# Patient Record
Sex: Female | Born: 1950
Health system: Southern US, Community
[De-identification: ages and names within clinical notes are randomized; demographics above are authoritative.]

## PROBLEM LIST (undated history)

## (undated) DIAGNOSIS — S82899A Other fracture of unspecified lower leg, initial encounter for closed fracture: Secondary | ICD-10-CM

## (undated) DIAGNOSIS — L111 Transient acantholytic dermatosis [Grover]: Secondary | ICD-10-CM

## (undated) DIAGNOSIS — K649 Unspecified hemorrhoids: Secondary | ICD-10-CM

## (undated) DIAGNOSIS — I82409 Acute embolism and thrombosis of unspecified deep veins of unspecified lower extremity: Secondary | ICD-10-CM

## (undated) HISTORY — PX: DILATION AND CURETTAGE OF UTERUS: SHX78

## (undated) HISTORY — PX: OTHER SURGICAL HISTORY: SHX169

## (undated) HISTORY — DX: Acute embolism and thrombosis of unspecified deep veins of unspecified lower extremity: I82.409

## (undated) HISTORY — DX: Other fracture of unspecified lower leg, initial encounter for closed fracture: S82.899A

---

## 1998-12-12 ENCOUNTER — Other Ambulatory Visit: Admission: RE | Admit: 1998-12-12 | Discharge: 1998-12-12 | Payer: Self-pay | Admitting: Obstetrics and Gynecology

## 2000-01-04 ENCOUNTER — Other Ambulatory Visit: Admission: RE | Admit: 2000-01-04 | Discharge: 2000-01-04 | Payer: Self-pay | Admitting: Obstetrics and Gynecology

## 2001-02-04 ENCOUNTER — Other Ambulatory Visit: Admission: RE | Admit: 2001-02-04 | Discharge: 2001-02-04 | Payer: Self-pay | Admitting: Obstetrics and Gynecology

## 2003-10-20 ENCOUNTER — Other Ambulatory Visit: Admission: RE | Admit: 2003-10-20 | Discharge: 2003-10-20 | Payer: Self-pay | Admitting: Obstetrics and Gynecology

## 2004-12-20 ENCOUNTER — Other Ambulatory Visit: Admission: RE | Admit: 2004-12-20 | Discharge: 2004-12-20 | Payer: Self-pay | Admitting: *Deleted

## 2005-12-06 LAB — HM MAMMOGRAPHY: HM Mammogram: NEGATIVE

## 2006-03-26 ENCOUNTER — Other Ambulatory Visit: Admission: RE | Admit: 2006-03-26 | Discharge: 2006-03-26 | Payer: Self-pay | Admitting: Obstetrics and Gynecology

## 2006-10-22 ENCOUNTER — Encounter
Admission: RE | Admit: 2006-10-22 | Discharge: 2006-10-22 | Payer: Self-pay | Admitting: Physical Medicine and Rehabilitation

## 2007-05-14 ENCOUNTER — Other Ambulatory Visit: Admission: RE | Admit: 2007-05-14 | Discharge: 2007-05-14 | Payer: Self-pay | Admitting: Obstetrics & Gynecology

## 2008-05-18 ENCOUNTER — Other Ambulatory Visit: Admission: RE | Admit: 2008-05-18 | Discharge: 2008-05-18 | Payer: Self-pay | Admitting: Obstetrics and Gynecology

## 2009-04-21 ENCOUNTER — Ambulatory Visit: Payer: Self-pay | Admitting: Family Medicine

## 2009-07-04 ENCOUNTER — Ambulatory Visit: Payer: Self-pay | Admitting: Family Medicine

## 2010-01-09 ENCOUNTER — Ambulatory Visit: Payer: Self-pay | Admitting: Family Medicine

## 2010-02-01 ENCOUNTER — Ambulatory Visit: Payer: Self-pay | Admitting: Family Medicine

## 2011-06-12 ENCOUNTER — Encounter: Payer: Self-pay | Admitting: Family Medicine

## 2011-06-26 ENCOUNTER — Telehealth: Payer: Self-pay | Admitting: Family Medicine

## 2011-06-26 MED ORDER — IBUPROFEN 800 MG PO TABS: 800.0000 mg | ORAL_TABLET | Freq: Three times a day (TID) | ORAL | Status: AC | PRN

## 2011-06-26 NOTE — Telephone Encounter (Signed)
Ibuprofen called in.

## 2012-02-12 ENCOUNTER — Encounter: Payer: Self-pay | Admitting: Family Medicine

## 2012-02-12 ENCOUNTER — Ambulatory Visit (INDEPENDENT_AMBULATORY_CARE_PROVIDER_SITE_OTHER): Payer: BC Managed Care – PPO | Admitting: Family Medicine

## 2012-02-12 VITALS — BP 110/72 | HR 75 | Wt 139.0 lb

## 2012-02-12 DIAGNOSIS — L989 Disorder of the skin and subcutaneous tissue, unspecified: Secondary | ICD-10-CM

## 2012-02-12 NOTE — Progress Notes (Signed)
  Subjective:    Patient ID: Alicia Alvarez, female    DOB: Feb 02, 1951, 61 y.o.   MRN: [id number]  HPI She is here for consult concerning skin lesions. She was seen by Dr. Leta Speller who gave her 5-FU which had no effect on it. These are mainly on the lower extremities. She did have a biopsy on one of the lesions which was apparently benign.   Review of Systems     Objective:   Physical Exam She has scattered flat lesions all but one are less than  0.5 cm. They are not erythematous and have distinct borders. One lesion is approximately 1 cm and slightly raised but nonpigmented       Assessment & Plan:   1. Skin lesions, generalized    I explained that all the lesions are benign. I did discuss various methods to help get rid of them including using Compound W or freezing them. They are more of a nuisance than they are actually any danger. Also mentioned getting a Zostavax shot. She states that she has had 2 episodes of shingles and at this time is not interested.

## 2012-02-27 ENCOUNTER — Telehealth: Payer: Self-pay | Admitting: Internal Medicine

## 2012-02-28 MED ORDER — CARISOPRODOL 350 MG PO TABS: ORAL_TABLET | ORAL | Status: DC

## 2012-02-28 NOTE — Telephone Encounter (Signed)
Called in soma 350mg  #12 for pt and will call and left message on pt vm notifying her that we sent rx to pharmacy

## 2012-02-28 NOTE — Telephone Encounter (Signed)
Call and soma 350 mg. Give her a dozen of them

## 2012-03-24 ENCOUNTER — Telehealth: Payer: Self-pay | Admitting: Family Medicine

## 2012-03-24 NOTE — Telephone Encounter (Signed)
She is having difficulty with trapezius and deltoid muscle pain. She has been doing a lot of swimming. She has seen a massage therapist who agrees with the above. I recommend she back off on swimming for least 2 weeks and if things calm down, start back at 50% level.

## 2012-08-15 ENCOUNTER — Telehealth: Payer: Self-pay | Admitting: Family Medicine

## 2012-08-15 NOTE — Telephone Encounter (Signed)
Idon't mind trying but a lot of times these cysts are very difficult to get fluid out of and they recur

## 2012-08-15 NOTE — Telephone Encounter (Signed)
CALLED PT LEFT MESSAGE WORD FOR WORD

## 2012-08-21 ENCOUNTER — Ambulatory Visit: Payer: BC Managed Care – PPO | Admitting: Family Medicine

## 2012-10-15 ENCOUNTER — Other Ambulatory Visit: Payer: Self-pay | Admitting: *Deleted

## 2012-10-15 MED ORDER — ESTRADIOL 0.0375 MG/24HR TD PTTW: 1.0000 | MEDICATED_PATCH | TRANSDERMAL | Status: DC

## 2012-10-15 NOTE — Telephone Encounter (Signed)
Aex 10/22/12 with DL

## 2012-10-15 NOTE — Addendum Note (Signed)
Addended by: Lorraine Lax on: 10/15/2012 02:30 PM   Modules accepted: Orders

## 2012-10-15 NOTE — Telephone Encounter (Signed)
Rx called in to pharmacy. 

## 2012-10-15 NOTE — Addendum Note (Signed)
Addended by: Lorraine Lax on: 10/15/2012 12:59 PM   Modules accepted: Orders

## 2012-10-21 ENCOUNTER — Encounter: Payer: Self-pay | Admitting: Certified Nurse Midwife

## 2012-10-22 ENCOUNTER — Other Ambulatory Visit: Payer: Self-pay | Admitting: Certified Nurse Midwife

## 2012-10-22 ENCOUNTER — Ambulatory Visit (INDEPENDENT_AMBULATORY_CARE_PROVIDER_SITE_OTHER): Payer: BC Managed Care – PPO | Admitting: Certified Nurse Midwife

## 2012-10-22 ENCOUNTER — Encounter: Payer: Self-pay | Admitting: Certified Nurse Midwife

## 2012-10-22 VITALS — BP 106/64 | Ht 66.75 in | Wt 142.0 lb

## 2012-10-22 DIAGNOSIS — Z01419 Encounter for gynecological examination (general) (routine) without abnormal findings: Secondary | ICD-10-CM

## 2012-10-22 NOTE — Progress Notes (Addendum)
62 y.o. DivorcedCaucasian female   Alicia Alvarez here for annual exam.  Menopausal on HRT desires continuance.  Continues to have hot flashes and night sweats when she tries to stop her medications. Denies any vaginal bleeding or vaginal dryness.  Not sexually active.  Sees PCP prn, plans visit for labs this year. No health issues today.  Patient's last menstrual period was 07/30/2002.          Sexually active: no  The current method of family planning is abstinence.    Exercising: yes  yoga, swimming Last mammogram: 11/26/11, f/u left breast 5/13 Last pap: 05/25/10 Last BMD: none Alcohol:2-3 a week Tobacco: none   Health Maintenance  Topic Date Due  . Influenza Vaccine  03/30/1951  . Pap Smear  11/16/1969  . Tetanus/tdap  11/17/1970  . Colonoscopy  11/16/2001  . Mammogram  12/07/2007  . Zostavax  11/17/2011    Family History  Problem Relation Age of Onset  . Cancer Father     PROSTATE    There is no problem list on file for this patient.   Past Medical History  Diagnosis Date  . Ankle fracture     right    Past Surgical History  Procedure Laterality Date  . Cesarean section      Allergies: Macrodantin and Sulfa antibiotics  Current Outpatient Prescriptions  Medication Sig Dispense Refill  . estradiol (MINIVELLE) 0.0375 MG/24HR Place 1 patch onto the skin 2 (two) times a week.  1 patch  0  . progesterone (PROMETRIUM) 100 MG capsule Take 100 mg by mouth daily.         No current facility-administered medications for this visit.    ROS: Pertinent items are noted in HPI.  Exam:    BP 106/64  Ht 5' 6.75" (1.695 m)  Wt 142 lb (64.411 kg)  BMI 22.42 kg/m2  LMP 07/30/2002 Weight change: @WEIGHTCHANGE @ Last 3 height recordings:  Ht Readings from Last 3 Encounters:  10/22/12 5' 6.75" (1.695 m)   General appearance: alert, cooperative and appears stated age Head: Normocephalic, without obvious abnormality, atraumatic Neck: no adenopathy, supple, symmetrical,  trachea midline and thyroid not enlarged, symmetric, no tenderness/mass/nodules Lungs: clear to auscultation bilaterally Breasts: normal appearance, no masses or tenderness, Inspection negative, No nipple retraction or dimpling, No nipple discharge or bleeding, No axillary or supraclavicular adenopathy Heart: regular rate and rhythm Abdomen: soft, non-tender; bowel sounds normal; no masses,  no organomegaly Extremities: extremities normal, atraumatic, no cyanosis or edema Skin: Skin color, texture, turgor normal. No rashes or lesions Lymph nodes: Cervical, supraclavicular, and axillary nodes normal. no inguinal nodes palpated Neurologic: Alert and oriented X 3, normal strength and tone. Normal symmetric reflexes. Normal coordination and gait   Pelvic: External genitalia:  no lesions              Urethra: not indicated and normal appearing urethra with no masses, tenderness or lesions              Bartholins and Skenes: normal, Bartholin's, Urethra, Skene's normal                 Vagina: normal appearing vagina with normal color and discharge, no lesions              Cervix: normal appearance              Pap taken: yes        Bimanual Exam:  Uterus:  uterus is normal size, shape, consistency and nontender  Adnexa:    not indicated and normal adnexa in size, nontender and no masses                                      Rectovaginal: Confirms                                      Anus:  normal sphincter tone, no lesions  A:1- well woman exam 2- Menopausal on HRT       P:1- Reviewed health wellness pertinent to exam. Recommend mammogram and  pap smear annually if indicated 2- Rx Minivelle 0.0375mg  2 x weekly x 1 year with instructions and warning signs and symptoms given Rx Prometrium 100 mg daily # 3months supply x 1 year.  return annually or prn      An After Visit Summary was printed and given to the patient.  Reviewed, TL

## 2012-10-22 NOTE — Patient Instructions (Addendum)

## 2012-10-23 LAB — IPS PAP TEST WITH HPV

## 2012-10-31 ENCOUNTER — Encounter: Payer: Self-pay | Admitting: Certified Nurse Midwife

## 2012-12-02 ENCOUNTER — Other Ambulatory Visit: Payer: Self-pay | Admitting: *Deleted

## 2012-12-02 MED ORDER — PROGESTERONE MICRONIZED 100 MG PO CAPS: 100.0000 mg | ORAL_CAPSULE | Freq: Every day | ORAL | Status: DC

## 2012-12-02 MED ORDER — ESTRADIOL 0.0375 MG/24HR TD PTTW: 1.0000 | MEDICATED_PATCH | TRANSDERMAL | Status: DC

## 2012-12-02 NOTE — Telephone Encounter (Signed)
Faxed refill request received from pharmacy for [facility] Last filled by MD on 10/15/12, #8 X O Last AEX - 10/22/12 Next AEX - not scheduled Note in AEX document stating progesterone and MINIVELLE refilled x 1 year.  RX's not sent at that time.  Both RX's sent today.

## 2012-12-08 ENCOUNTER — Other Ambulatory Visit: Payer: Self-pay | Admitting: *Deleted

## 2012-12-08 MED ORDER — PROGESTERONE MICRONIZED 100 MG PO CAPS: 100.0000 mg | ORAL_CAPSULE | Freq: Every day | ORAL | Status: DC

## 2012-12-08 NOTE — Telephone Encounter (Signed)
Faxed refill request received from pharmacy for 90 DAY supply PROGESTERONE Last filled by MD on 12/02/12, #30 X 11 Last AEX - 10/22/12 Next AEX - not scheduled 90 day supply sent.

## 2013-06-30 ENCOUNTER — Telehealth: Payer: Self-pay | Admitting: Family Medicine

## 2013-06-30 NOTE — Telephone Encounter (Signed)
I know what kind of a reaction she had. I can't write a note without Whether was a local reaction or allergic

## 2013-06-30 NOTE — Telephone Encounter (Signed)
LEFT MESSAGE FOR PT ON CELL # THAT DR.LALONDE COULD NOT WRITE A NOTE BECAUSE HE DOESN'T KNOW IF IT WAS A LOCAL OR ALLERGIC REACTION TO SHOT PT HAS NOT BEEN SEEN SINCE July 2013

## 2013-07-02 ENCOUNTER — Encounter: Payer: Self-pay | Admitting: Family Medicine

## 2013-07-02 ENCOUNTER — Ambulatory Visit (INDEPENDENT_AMBULATORY_CARE_PROVIDER_SITE_OTHER): Payer: BC Managed Care – PPO | Admitting: Family Medicine

## 2013-07-02 VITALS — BP 100/70 | HR 60 | Wt 139.0 lb

## 2013-07-02 DIAGNOSIS — T50A95A Adverse effect of other bacterial vaccines, initial encounter: Secondary | ICD-10-CM

## 2013-07-02 DIAGNOSIS — T50995A Adverse effect of other drugs, medicaments and biological substances, initial encounter: Secondary | ICD-10-CM

## 2013-07-02 DIAGNOSIS — T50B95A Adverse effect of other viral vaccines, initial encounter: Secondary | ICD-10-CM

## 2013-07-02 NOTE — Progress Notes (Signed)
   Subjective:    Patient ID: Alicia Alvarez, female    DOB: [birth year], 62 y.o.   MRN: [id number]  HPI Is here for consult concerning flu shot. She had a flu shot several years ago and had difficulty after that for 3 or 4 days with myalgias, fatigue, headache. No rash present . She states that within 6 hours she developed fatigue and muscle weakness lasted for 3-4 days.   Review of Systems     Objective:   Physical Exam Alert and in no distress otherwise not examined       Assessment & Plan:  Adverse reaction to influenza vaccine, initial encounter  Her reaction to the vaccine occurred relatively quickly after receiving it and therefore I am very reluctant to have her get another shot. This does not seem to meet the typical flulike symptoms that can occur for several days after an injection. I did discuss the possibility of her getting a Zostavax and she is to let have that shot as well.

## 2013-09-28 ENCOUNTER — Ambulatory Visit (INDEPENDENT_AMBULATORY_CARE_PROVIDER_SITE_OTHER): Payer: 59 | Admitting: Family Medicine

## 2013-09-28 ENCOUNTER — Telehealth: Payer: Self-pay | Admitting: Family Medicine

## 2013-09-28 DIAGNOSIS — R350 Frequency of micturition: Secondary | ICD-10-CM

## 2013-09-28 DIAGNOSIS — R300 Dysuria: Secondary | ICD-10-CM

## 2013-09-28 DIAGNOSIS — N390 Urinary tract infection, site not specified: Secondary | ICD-10-CM

## 2013-09-28 LAB — POCT URINALYSIS DIPSTICK
BILIRUBIN UA: NEGATIVE
GLUCOSE UA: NEGATIVE
KETONES UA: NEGATIVE
Nitrite, UA: NEGATIVE
Urobilinogen, UA: NEGATIVE
pH, UA: 7

## 2013-09-28 MED ORDER — CIPROFLOXACIN HCL 500 MG PO TABS: 500.0000 mg | ORAL_TABLET | Freq: Two times a day (BID) | ORAL | Status: DC

## 2013-09-28 NOTE — Telephone Encounter (Signed)
I am okay with her coming in for a UA only. Put her  on my schedule get the results

## 2013-09-28 NOTE — Progress Notes (Signed)
   Subjective:    Patient ID: Alicia Alvarez, female    DOB: [birth year], 63 y.o.   MRN: [id number]  HPI She complains of urinary frequency, dysuria and chills.  Review of Systems     Objective:   Physical Exam Patient was not seen. A urinalysis dipstick was positive for white cells and red cells       Assessment & Plan:

## 2013-09-28 NOTE — Progress Notes (Signed)
Called in CIPRO 500mg  to [facility] pharmacy @ [phone]

## 2013-09-29 ENCOUNTER — Telehealth: Payer: Self-pay | Admitting: Family Medicine

## 2013-09-29 NOTE — Telephone Encounter (Signed)
Call in amoxicillin 875 twice a day. #20

## 2013-09-29 NOTE — Telephone Encounter (Signed)
Medication called in 

## 2013-10-08 ENCOUNTER — Telehealth: Payer: Self-pay | Admitting: Family Medicine

## 2013-10-08 NOTE — Telephone Encounter (Signed)
875 twice a day, 20 pills

## 2013-10-08 NOTE — Telephone Encounter (Signed)
Call Amoxil in to her pharmacy.

## 2013-10-08 NOTE — Telephone Encounter (Signed)
What strength and instructions?

## 2013-10-15 ENCOUNTER — Other Ambulatory Visit: Payer: Self-pay

## 2013-10-15 MED ORDER — AMOXICILLIN 875 MG PO TABS: 875.0000 mg | ORAL_TABLET | Freq: Two times a day (BID) | ORAL | Status: DC

## 2013-11-04 ENCOUNTER — Telehealth: Payer: Self-pay | Admitting: Family Medicine

## 2013-11-12 NOTE — Telephone Encounter (Signed)
tsd  °

## 2013-12-07 ENCOUNTER — Ambulatory Visit (INDEPENDENT_AMBULATORY_CARE_PROVIDER_SITE_OTHER): Payer: 59 | Admitting: Family Medicine

## 2013-12-07 ENCOUNTER — Telehealth: Payer: Self-pay | Admitting: Family Medicine

## 2013-12-07 DIAGNOSIS — R350 Frequency of micturition: Secondary | ICD-10-CM

## 2013-12-07 LAB — POCT URINALYSIS DIPSTICK
Bilirubin, UA: NEGATIVE
Blood, UA: NEGATIVE
GLUCOSE UA: NEGATIVE
KETONES UA: NEGATIVE
Nitrite, UA: NEGATIVE
Protein, UA: NEGATIVE
Urobilinogen, UA: NEGATIVE
pH, UA: 5

## 2013-12-07 MED ORDER — DOXYCYCLINE HYCLATE 100 MG PO TABS: 100.0000 mg | ORAL_TABLET | Freq: Two times a day (BID) | ORAL | Status: DC

## 2013-12-07 NOTE — Progress Notes (Signed)
   Subjective:    Patient ID: Alicia Alvarez, female    DOB: Nov 23, 1950, 63 y.o.   MRN: [id number]  HPI She was treated over the phone in March for frequency and dysuria. She was given Cipro and had unacceptable side effects. She was then switched to Amoxil and her symptoms were relieved. She started having frequency and dysuria again yesterday. She apparently has an allergic reaction to sulfa and Macrodantin. She has been more sexually active recently and thinks this is playing a role. She is also having second thoughts about the relationship she is involved in.  Review of Systems     Objective:   Physical Exam Alert and in no distress otherwise not examined Microscopic evaluation of the urine did show bacteria and white cells.      Assessment & Plan:  Urinary frequency - Plan: Urinalysis Dipstick, doxycycline (VIBRA-TABS) 100 MG tablet  discussed treatment with her. Recommend she obtain her bladder after sexual activity. Discussed the possibility of different sexual positions playing a role in this as well as tensely down the Road using an antibiotic after sex for prevention. We then talked about the relationship she is in. She is getting counseling to help with this. She does have a good handle on how to approach this situation.

## 2013-12-07 NOTE — Telephone Encounter (Signed)
Pt states you treated her several weeks ago for UTI.  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  She states you will only want a urine sample and not an appointment.  Please advise [phone].  I have her on the schedule for this morning.

## 2013-12-15 ENCOUNTER — Other Ambulatory Visit: Payer: Self-pay | Admitting: *Deleted

## 2013-12-15 MED ORDER — PROGESTERONE MICRONIZED 100 MG PO CAPS: 100.0000 mg | ORAL_CAPSULE | Freq: Every day | ORAL | Status: DC

## 2013-12-15 MED ORDER — ESTRADIOL 0.0375 MG/24HR TD PTTW: 1.0000 | MEDICATED_PATCH | TRANSDERMAL | Status: DC

## 2013-12-15 NOTE — Telephone Encounter (Signed)
Last AEX 10/22/12 Last refill 11/2012 for 1 year.  Patient needs to schedule AEX and have MMG done. Rx sent for 30 days. - Per Ms Jackelyn Poling. - Patient notified and verbalized understanding. - Patient schedule appt for 6/4 10:00 am.

## 2013-12-31 ENCOUNTER — Ambulatory Visit: Payer: 59 | Admitting: Certified Nurse Midwife

## 2014-01-27 ENCOUNTER — Ambulatory Visit (INDEPENDENT_AMBULATORY_CARE_PROVIDER_SITE_OTHER): Payer: 59 | Admitting: Family Medicine

## 2014-01-27 VITALS — BP 100/60 | HR 65 | Temp 97.9°F | Wt 140.0 lb

## 2014-01-27 DIAGNOSIS — R51 Headache: Secondary | ICD-10-CM

## 2014-01-27 DIAGNOSIS — H9209 Otalgia, unspecified ear: Secondary | ICD-10-CM

## 2014-01-27 DIAGNOSIS — H9202 Otalgia, left ear: Secondary | ICD-10-CM

## 2014-01-27 DIAGNOSIS — R519 Headache, unspecified: Secondary | ICD-10-CM

## 2014-01-27 NOTE — Progress Notes (Signed)
   Subjective:    Patient ID: Alicia Alvarez, female    DOB: [birth year], 63 y.o.   MRN: [id number]  HPI She is here for consult concerning left ear pain. She does swim a lot and does use earplugs. She cleans her ears with peroxide and vinegar. She also states that the ears do itch a lot. She is concerned about possible ruptured eardrum. She also states she has had difficulty with intermittent right retro-orbital throbbing headache. She has not taken any medications for this and this has not interfered with the quality of her life. She's had no blurred vision, double vision, nausea, vomiting, photo or phonophobia.   Review of Systems     Objective:   Physical Exam Page Spiro and in no distress. Both tympanic membranes and canals are normal.       Assessment & Plan:  Otalgia of left ear  Headache, retro-ocular  recommend she use a combination of alcohol and white vinegar to clean her ears and not use Q-tips. Recommend cortisone cream for the itching on the ear meatus. Also discussed her headache but informed her that I would be more information concerning what, when, where and why.

## 2014-01-27 NOTE — Patient Instructions (Signed)
Use a combination of alcohol and white vinegar to help keep clean and use cortisone cream on the external part of ear. No Q-tips Pay attention to your headaches in terms of what when and where and why

## 2014-02-12 ENCOUNTER — Ambulatory Visit (INDEPENDENT_AMBULATORY_CARE_PROVIDER_SITE_OTHER): Payer: 59 | Admitting: Certified Nurse Midwife

## 2014-02-12 ENCOUNTER — Encounter: Payer: Self-pay | Admitting: Certified Nurse Midwife

## 2014-02-12 VITALS — BP 100/66 | HR 70 | Resp 16 | Ht 66.75 in | Wt 142.0 lb

## 2014-02-12 DIAGNOSIS — Z1211 Encounter for screening for malignant neoplasm of colon: Secondary | ICD-10-CM

## 2014-02-12 DIAGNOSIS — N951 Menopausal and female climacteric states: Secondary | ICD-10-CM

## 2014-02-12 DIAGNOSIS — Z01419 Encounter for gynecological examination (general) (routine) without abnormal findings: Secondary | ICD-10-CM

## 2014-02-12 DIAGNOSIS — Z0000 Encounter for general adult medical examination without abnormal findings: Secondary | ICD-10-CM

## 2014-02-12 LAB — COMPREHENSIVE METABOLIC PANEL
ALBUMIN: 4.6 g/dL (ref 3.5–5.2)
ALK PHOS: 56 U/L (ref 39–117)
ALT: 26 U/L (ref 0–35)
AST: 20 U/L (ref 0–37)
BILIRUBIN TOTAL: 0.7 mg/dL (ref 0.2–1.2)
BUN: 12 mg/dL (ref 6–23)
CO2: 29 meq/L (ref 19–32)
Calcium: 9.9 mg/dL (ref 8.4–10.5)
Chloride: 100 mEq/L (ref 96–112)
Creat: 0.75 mg/dL (ref 0.50–1.10)
GLUCOSE: 84 mg/dL (ref 70–99)
POTASSIUM: 4.6 meq/L (ref 3.5–5.3)
Sodium: 136 mEq/L (ref 135–145)
Total Protein: 6.7 g/dL (ref 6.0–8.3)

## 2014-02-12 LAB — LIPID PANEL
CHOL/HDL RATIO: 2 ratio
CHOLESTEROL: 220 mg/dL — AB (ref 0–200)
HDL: 109 mg/dL (ref >39)
LDL Cholesterol: 96 mg/dL (ref 0–99)
Triglycerides: 73 mg/dL (ref <150)
VLDL: 15 mg/dL (ref 0–40)

## 2014-02-12 LAB — HEMOGLOBIN, FINGERSTICK: HEMOGLOBIN, FINGERSTICK: 13.3 g/dL (ref 12.0–16.0)

## 2014-02-12 MED ORDER — ESTRADIOL 0.0375 MG/24HR TD PTTW: 1.0000 | MEDICATED_PATCH | TRANSDERMAL | Status: DC

## 2014-02-12 MED ORDER — PROGESTERONE MICRONIZED 100 MG PO CAPS: 100.0000 mg | ORAL_CAPSULE | Freq: Every day | ORAL | Status: DC

## 2014-02-12 NOTE — Progress Notes (Signed)
Reviewed personally.  M. Suzanne Deshawn Skelley, MD.  

## 2014-02-12 NOTE — Patient Instructions (Signed)

## 2014-02-12 NOTE — Progress Notes (Signed)
63 y.o. [id number] Divorced Caucasian Fe here for annual exam.  Menopausal on HRT. Denies vaginal bleeding or vaginal dryness.Sees  PCP for aex, no labs. Had one UTI in the past year, no issues. Does not plan to schedule  Colonoscopy, but will do IFOB this year. Fasted for labs today. HRT working well, desires continuance. Working on program for therapeutic touch with [employer]. No health issues today.  Patient's last menstrual period was 07/30/2002.          Sexually active: Yes.    The current method of family planning is post menopausal status.    Exercising: Yes.    yoga,running & swimming Smoker:  no  Health Maintenance: Pap: 10-22-12 neg HPV HR neg MMG: 11/26/11,f/u left breast 5/13. Scheduled for 2015 Colonoscopy: none BMD:  none TDaP: 2010 Labs: Hgb-13.3 Self breast exam: done occ   reports that she has quit smoking. She has never used smokeless tobacco. She reports that she drinks about 1.5 ounces of alcohol per week. She reports that she does not use illicit drugs.  Past Medical History  Diagnosis Date  . Ankle fracture     right    Past Surgical History  Procedure Laterality Date  . Cesarean section      Current Outpatient Prescriptions  Medication Sig Dispense Refill  . estradiol (MINIVELLE) 0.0375 MG/24HR Place 1 patch onto the skin 2 (two) times a week.  8 patch  0  . progesterone (PROMETRIUM) 100 MG capsule Take 1 capsule (100 mg total) by mouth daily.  30 capsule  0   No current facility-administered medications for this visit.    Family History  Problem Relation Age of Onset  . Cancer Father     PROSTATE    ROS:  Pertinent items are noted in HPI.  Otherwise, a comprehensive ROS was negative.  Exam:   BP 100/66  Pulse 70  Resp 16  Ht 5' 6.75" (1.695 m)  Wt 142 lb (64.411 kg)  BMI 22.42 kg/m2  LMP 07/30/2002 Height: 5' 6.75" (169.5 cm)  Ht Readings from Last 3 Encounters:  02/12/14 5' 6.75" (1.695 m)  10/22/12 5' 6.75" (1.695 m)    General appearance:  alert, cooperative and appears stated age Head: Normocephalic, without obvious abnormality, atraumatic Neck: no adenopathy, supple, symmetrical, trachea midline and thyroid normal to inspection and palpation and non-palpable Lungs: clear to auscultation bilaterally Breasts: normal appearance, no masses or tenderness, No nipple retraction or dimpling, No nipple discharge or bleeding, No axillary or supraclavicular adenopathy Heart: regular rate and rhythm Abdomen: soft, non-tender; no masses,  no organomegaly Extremities: extremities normal, atraumatic, no cyanosis or edema Skin: Skin color, texture, turgor normal. No rashes or lesions Lymph nodes: Cervical, supraclavicular, and axillary nodes normal. No abnormal inguinal nodes palpated Neurologic: Grossly normal   Pelvic: External genitalia:  no lesions              Urethra:  normal appearing urethra with no masses, tenderness or lesions              Bartholin's and Skene's: normal                 Vagina: normal appearing vagina with normal color and discharge, no lesions              Cervix: normal, non tender              Pap taken: No. Bimanual Exam:  Uterus:  normal size, contour, position, consistency, mobility, non-tender and midposition  Adnexa: normal adnexa and no mass, fullness, tenderness               Rectovaginal: Confirms               Anus:  normal sphincter tone, no lesions  A:  Well Woman with normal exam  Menopausal on HRT desires continuation  Fasting labs  Colonoscopy due declines  P:   Reviewed health and wellness pertinent to exam  Rx Minivelle see order  Rx Prometrium see order  Labs: Lipid panel, CMP, Vitamin D  Pap smear not taken today  counseled on breast self exam, mammography screening, use and side effects of HRT and evaluation yearly for continuance, adequate intake of calcium and vitamin D, diet and exercise, risk and benefits of colonoscopy, declines scheduling.  return annually or  prn  An After Visit Summary was printed and given to the patient.

## 2014-02-13 LAB — VITAMIN D 25 HYDROXY (VIT D DEFICIENCY, FRACTURES): VIT D 25 HYDROXY: 49 ng/mL (ref 30–89)

## 2014-03-15 ENCOUNTER — Ambulatory Visit: Payer: 59 | Admitting: Family Medicine

## 2014-03-23 ENCOUNTER — Telehealth: Payer: Self-pay | Admitting: Family Medicine

## 2014-03-23 NOTE — Telephone Encounter (Signed)
Requesting a letter stating that massage therapy would benefit patient medically due to the work that she does. Pt is up on her feet all day and leaning over a massage table. If pt gets this letter, insur will cover portion of her massage therapy bills.

## 2014-03-24 NOTE — Telephone Encounter (Signed)
Pt needs letter

## 2014-03-24 NOTE — Telephone Encounter (Signed)
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  When she gets a massage herself it remedies the pain in her back and muscle tension in her arms.  She goes to the massage therapist 1 x per month.  She needs letter.

## 2014-03-24 NOTE — Telephone Encounter (Signed)
I'm not sure what she is truly asking for. 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 Find out exactly what she thinks it'll help put that in her letter

## 2014-03-24 NOTE — Telephone Encounter (Signed)
Called pt and LM to call with more info

## 2014-03-25 NOTE — Telephone Encounter (Signed)
Make up a letter for me

## 2014-03-29 ENCOUNTER — Encounter: Payer: Self-pay | Admitting: Family Medicine

## 2014-03-29 NOTE — Telephone Encounter (Signed)
Done

## 2014-04-02 ENCOUNTER — Encounter: Payer: Self-pay | Admitting: Family Medicine

## 2014-04-02 ENCOUNTER — Encounter: Payer: Self-pay | Admitting: Medical

## 2014-04-02 ENCOUNTER — Ambulatory Visit (INDEPENDENT_AMBULATORY_CARE_PROVIDER_SITE_OTHER): Payer: 59 | Admitting: Medical

## 2014-04-02 VITALS — BP 110/80 | HR 64 | Temp 97.9°F | Resp 16 | Wt 140.0 lb

## 2014-04-02 DIAGNOSIS — H6123 Impacted cerumen, bilateral: Secondary | ICD-10-CM

## 2014-04-02 DIAGNOSIS — H6120 Impacted cerumen, unspecified ear: Secondary | ICD-10-CM

## 2014-04-02 DIAGNOSIS — H68003 Unspecified Eustachian salpingitis, bilateral: Secondary | ICD-10-CM

## 2014-04-02 DIAGNOSIS — H68009 Unspecified Eustachian salpingitis, unspecified ear: Secondary | ICD-10-CM

## 2014-04-02 MED ORDER — AMOXICILLIN 875 MG PO TABS
875.0000 mg | ORAL_TABLET | Freq: Two times a day (BID) | ORAL | Status: DC
Start: 2014-04-02 — End: 2014-09-10

## 2014-04-02 NOTE — Progress Notes (Signed)
Subjective: Been dealing with ear discomfort since July.  Saw Dr. Redmond [facility] for this prior.  10 days ago went to employee health, advised she had wax on tympanic membrane and symptoms of eustachian tube dysfunction.  She notes ongoing ear popping, discomfort.   Some ringing in both ears.  Tried Flonase ,sudafed TID.   Sometimes feels drainage down throat.  No fever, no nausea or vomiting, no dizziness.  At times mild popping in frontal sinuses.  Some eye itching.   No teeth pain, no decreased sense of smell or taste. No antibiotic use recently.   Not prone to getting sinus infection or ear infection.  No cough.   No sore throat.  Swims 3 miles per week.  Wears wax ear plugs. Wears swim cap.  No hx/o frequent sinus infection or ear infection.  Is going out of town next weekend, flying on plane .  Has concerns about ear pressure.  No other aggravating or relieving across.  No other c/o.  ROS as in subjective  Objective Filed Vitals:   04/02/14 1454  BP: 110/80  Pulse: 64  Temp: 97.9 F (36.6 C)  Resp: 16    General appearance: alert, no distress, WD/WN HEENT: normocephalic, sclerae anicteric, thick dry cerumen anterior ear canal bilat pressing against TMs, otherwise TMs pearly, nares patent, no discharge or erythema, pharynx normal Oral cavity: MMM, no lesions Neck: supple, no lymphadenopathy, no thyromegaly, no masses Lungs: CTA bilaterally, no wheezes, rhonchi, or rales  Assessment: Encounter Diagnoses  Name Primary?  . Eustachian tube salpingitis, bilateral Yes  . Excessive cerumen in both ear canals    Plan: Discussed findings.  Discussed risk/benefits of procedure and patient agrees to procedure. Successfully used warm water lavage to remove impacted cerumen from bilat ear canal. Patient tolerated procedure well. Advised they avoid using any cotton swabs or other devices to clean the ear canals.  Use basic hygiene as discussed.  Follow up prn.     C/t Flonase nasal 1-2 times daily,  good hydration, discussed cleaning throat and swallowing/chewing gum to equalize pressure particularly in the air flight.  C/t sudafed 1-2 times daily for another several days if needed.  Can consider zyrtec or benadryl prn OTC.  If not improving in 3-4 days, begin Amoxicillin.

## 2014-04-02 NOTE — Patient Instructions (Signed)
Thank you for giving me the opportunity to serve you today.    Your diagnosis today includes: Encounter Diagnoses  Name Primary?  . Eustachian tube salpingitis, bilateral Yes  . Excessive cerumen in both ear canals      Specific recommendations today include:  Continue Flonase nasal 1-2 times daily for a few more weeks until back to normal  You may use Sudafed 1-2 times daily another several days until improved  Consider Zyrtec or Benadryl daily for 1-2 weeks  Hydrate well with water daily  Consider nasal saline flushes/neti pot daily  If not seeing much improvement in the next 3-5 days, then begin amoxicillin  Chew gum or swallow often to relieve ear pressure when flying  Return if symptoms worsen or fail to improve.    I have included other useful information below for your review.   Serous Otitis Media Serous otitis media is fluid in the middle ear space. This space contains the bones for hearing and air. Air in the middle ear space helps to transmit sound.  The air gets there through the eustachian tube. This tube goes from the back of the nose (nasopharynx) to the middle ear space. It keeps the pressure in the middle ear the same as the outside world. It also helps to drain fluid from the middle ear space. CAUSES  Serous otitis media occurs when the eustachian tube gets blocked. Blockage can come from:  Ear infections.  Colds and other upper respiratory infections.  Allergies.  Irritants such as cigarette smoke.  Sudden changes in air pressure (such as descending in an airplane).  Enlarged adenoids.  A mass in the nasopharynx. During colds and upper respiratory infections, the middle ear space can become temporarily filled with fluid. This can happen after an ear infection also. Once the infection clears, the fluid will generally drain out of the ear through the eustachian tube. If it does not, then serous otitis media occurs. SIGNS AND SYMPTOMS    Hearing loss.  A feeling of fullness in the ear, without pain.  Young children may not show any symptoms but may show slight behavioral changes, such as agitation, ear pulling, or crying. DIAGNOSIS  Serous otitis media is diagnosed by an ear exam. Tests may be done to check on the movement of the eardrum. Hearing exams may also be done. TREATMENT  The fluid most often goes away without treatment. If allergy is the cause, allergy treatment may be helpful. Fluid that persists for several months may require minor surgery. A small tube is placed in the eardrum to:  Drain the fluid.  Restore the air in the middle ear space. In certain situations, antibiotic medicines are used to avoid surgery. Surgery may be done to remove enlarged adenoids (if this is the cause). HOME CARE INSTRUCTIONS   Keep children away from tobacco smoke.  Keep all follow-up visits as directed by your health care provider. SEEK MEDICAL CARE IF:   Your hearing is not better in 3 months.  Your hearing is worse.  You have ear pain.  You have drainage from the ear.  You have dizziness.  You have serous otitis media only in one ear or have any bleeding from your nose (epistaxis).  You notice a lump on your neck. MAKE SURE YOU:  Understand these instructions.   Will watch your condition.   Will get help right away if you are not doing well or get worse.  Document Released: 10/06/2003 Document Revised: 11/30/2013 Document Reviewed: 02/10/2013  ExitCare Patient Information 2015 [place]. This information is not intended to replace advice given to you by your health care provider. Make sure you discuss any questions you have with your health care provider.

## 2014-04-13 ENCOUNTER — Telehealth: Payer: Self-pay

## 2014-04-13 NOTE — Telephone Encounter (Signed)
Called patient to remind her to turn in her ifob. Left detailed message on mobile phone. DPR signed. Routed to [employer] Encounter closed

## 2014-05-31 ENCOUNTER — Encounter: Payer: Self-pay | Admitting: Medical

## 2014-08-23 ENCOUNTER — Telehealth: Payer: Self-pay | Admitting: Family Medicine

## 2014-08-23 NOTE — Telephone Encounter (Signed)
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 She states you have treated her for this before. Pt is requesting something be sent in for her. [facility]. She can be reached at [phone]

## 2014-08-24 ENCOUNTER — Telehealth: Payer: Self-pay | Admitting: Family Medicine

## 2014-08-24 MED ORDER — VALACYCLOVIR HCL 1 G PO TABS: ORAL_TABLET | ORAL | Status: DC

## 2014-08-24 NOTE — Telephone Encounter (Signed)
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 Pt states that it is indeed Herpes type 1.

## 2014-08-25 ENCOUNTER — Telehealth: Payer: Self-pay | Admitting: Family Medicine

## 2014-08-25 NOTE — Telephone Encounter (Signed)
Requesting a call about directions on Valtrex

## 2014-08-25 NOTE — Telephone Encounter (Signed)
Dr.Lalonde is it 2 tab 1 day when there is a break out

## 2014-08-30 ENCOUNTER — Telehealth: Payer: Self-pay | Admitting: Obstetrics & Gynecology

## 2014-08-30 NOTE — Telephone Encounter (Signed)
Spoke with patient. 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 Used one day ovule treatment and feels that all the medication did not stay in vagina.  Patient is going to try a 3 day treatment tonight and will call back if she does not want to keep appointment for Friday.   Routing to provider for final review. Patient agreeable to disposition. Will close encounter

## 2014-08-30 NOTE — Telephone Encounter (Signed)
Patient has a yeast infection and wants to come in on Friday. I scheduled her for Friday 09/03/14 with DL is this ok to wait this long ?

## 2014-09-03 ENCOUNTER — Ambulatory Visit: Payer: 59 | Admitting: Certified Nurse Midwife

## 2014-09-09 ENCOUNTER — Telehealth: Payer: Self-pay | Admitting: Obstetrics & Gynecology

## 2014-09-09 NOTE — Telephone Encounter (Signed)
Patient is having some vaginal irritation and wants to come in tomorrow.

## 2014-09-09 NOTE — Telephone Encounter (Signed)
Call to patient. Advised reviewed with Dr [name]. 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 Treated with Zovirax by PCP then yeast infection started. Symptoms improved with OTC treatment until burning, swelling and redness began. Patient states she does not have discharge like yeast infection. Advised OV tomorrow with  Dr [name] at [phone]. Instructed not to apply any further cram to area. Sitz bath if needed and hydrocortisone ointment if needed but try to avoid applying anything else to area until seen by MD. Patient states she is ok till am and can avoid any further products tonight.   Routing to provider for final review. Patient agreeable to disposition. Will close encounter

## 2014-09-09 NOTE — Telephone Encounter (Signed)
Spoke with patient. Patient states that she developed a "really bad yeast infection" last week which she treated with Monistat 3. Symptoms went away but have returned. Patient is experiencing vaginal redness and burning. Denies any discharge or urinary symptoms. "It is really irritated like vaginitis." Patient is requesting appointment tomorrow to be seen for evaluation. Advised will need to speak with providers and return call with appointment times to get her scheduled. Patient is agreeable.

## 2014-09-10 ENCOUNTER — Ambulatory Visit (INDEPENDENT_AMBULATORY_CARE_PROVIDER_SITE_OTHER): Payer: 59 | Admitting: Obstetrics & Gynecology

## 2014-09-10 VITALS — BP 104/70 | HR 72 | Resp 16 | Wt 145.4 lb

## 2014-09-10 DIAGNOSIS — N949 Unspecified condition associated with female genital organs and menstrual cycle: Secondary | ICD-10-CM

## 2014-09-10 DIAGNOSIS — N898 Other specified noninflammatory disorders of vagina: Secondary | ICD-10-CM

## 2014-09-10 MED ORDER — HYDROCORTISONE ACETATE 25 MG RE SUPP: RECTAL | Status: DC

## 2014-09-10 NOTE — Progress Notes (Signed)
Subjective:     Patient ID: Helen Hashimoto, female   DOB: 1951-05-30, 64 y.o.   MRN: [id number]  HPI Very nice 47 G3P1A2 DWF here for complaint of vaginal pain and discharge.  Pt reports she thought she had a yeast vaginitis and she used an OTC monistat (one day) treatment and then used a three day treatment.  Now she is having worse burning and redness.  She feels she may have done some of this to herself with the OTC treatments.  No abdominal pain.  No back pain.  No fevers.  No urinary symptoms.  Review of Systems  All other systems reviewed and are negative.      Objective:   Physical Exam  Constitutional: She appears well-developed and well-nourished.  Abdominal: Soft. Bowel sounds are normal.  Genitourinary:    There is rash on the right labia. There is no tenderness, lesion or injury on the right labia. There is no tenderness, lesion or injury on the left labia. Vaginal discharge found.  Lymphadenopathy:       Right: No inguinal adenopathy present.       Left: No inguinal adenopathy present.  Skin: Skin is warm and dry.  Psychiatric: She has a normal mood and affect.   Ph: 4.5.  KOH: neg yeast, neg whiff.  Saline: no trich, occ clue cells, many WBCs    Assessment:     Vaginal discharge Vaginal burning     Plan:     Wet smear is definitive for BV.  No yeast noted.  Affirm obtained.   Hydrocortisone suppositories 25mg  daily/nightly for 10 days GC/Chl on urine for complete evaluaiton

## 2014-09-11 LAB — WET PREP BY MOLECULAR PROBE
CANDIDA SPECIES: NEGATIVE
Gardnerella vaginalis: POSITIVE — AB
TRICHOMONAS VAG: NEGATIVE

## 2014-09-11 LAB — GC/CHLAMYDIA PROBE AMP, URINE
CHLAMYDIA, SWAB/URINE, PCR: NEGATIVE
GC Probe Amp, Urine: NEGATIVE

## 2014-09-14 ENCOUNTER — Telehealth: Payer: Self-pay

## 2014-09-14 MED ORDER — METRONIDAZOLE 500 MG PO TABS: 500.0000 mg | ORAL_TABLET | Freq: Two times a day (BID) | ORAL | Status: DC

## 2014-09-14 NOTE — Telephone Encounter (Signed)
-----   Message from Lyman Speller, MD sent at 09/12/2014 10:22 PM EST ----- Inform pt GC/Chl negative but additional testing showed BV.  Treat with flagyl 500mg  po bid x 7 days.  Take with food.  Can continue hydrocortisone suppositories as well.

## 2014-09-14 NOTE — Telephone Encounter (Signed)
Return call to Kelly. °

## 2014-09-14 NOTE — Telephone Encounter (Signed)
Returning call.

## 2014-09-14 NOTE — Telephone Encounter (Signed)
Lmtcb//kn 

## 2014-09-14 NOTE — Telephone Encounter (Signed)
Patient notified of results.//kn 

## 2014-09-14 NOTE — Telephone Encounter (Signed)
Patient notified of results. Rx sent to pharmacy, patient will call PRN.//kn

## 2014-09-19 ENCOUNTER — Encounter: Payer: Self-pay | Admitting: Obstetrics & Gynecology

## 2014-09-27 ENCOUNTER — Telehealth: Payer: Self-pay | Admitting: Obstetrics & Gynecology

## 2014-09-27 ENCOUNTER — Ambulatory Visit (INDEPENDENT_AMBULATORY_CARE_PROVIDER_SITE_OTHER): Payer: 59 | Admitting: Obstetrics & Gynecology

## 2014-09-27 VITALS — BP 108/70 | HR 68 | Resp 16 | Wt 143.2 lb

## 2014-09-27 DIAGNOSIS — K648 Other hemorrhoids: Secondary | ICD-10-CM

## 2014-09-27 DIAGNOSIS — K644 Residual hemorrhoidal skin tags: Secondary | ICD-10-CM

## 2014-09-27 DIAGNOSIS — N898 Other specified noninflammatory disorders of vagina: Secondary | ICD-10-CM

## 2014-09-27 MED ORDER — HYDROCORTISONE 2.5 % RE CREA: 1.0000 "application " | TOPICAL_CREAM | Freq: Two times a day (BID) | RECTAL | Status: DC

## 2014-09-27 MED ORDER — ESTROGENS, CONJUGATED 0.625 MG/GM VA CREA: TOPICAL_CREAM | VAGINAL | Status: DC

## 2014-09-27 NOTE — Progress Notes (Signed)
Subjective:     Patient ID: Alicia Alvarez, female   DOB: 04/09/1951, 64 y.o.   MRN: [id number]  HPI 64 yo G3P1A2 DWF here for follow up after being seen 09/10/14 for vaginal pain and discharge that started after using several OTC treatments for yeast.  Pt did have an affirm that was done showing BV.  Reports the Flagyl was very hard for her to take.  She is still having some burning sensation.  Denies discharge or odor.  Feeling better.  Did use the vaginal hydrocortisone suppositories and felt like this really helped as well.  Still not back to 100%.  Denies urinary symptoms.  No bowel issues as well, now that the flagyl is completed.  Review of Systems  All other systems reviewed and are negative.      Objective:   Physical Exam  Constitutional: She appears well-developed and well-nourished.  Abdominal: Soft. Bowel sounds are normal.  Genitourinary: Uterus normal. Rectal exam shows external hemorrhoid. There is no rash, tenderness, lesion or injury on the right labia. There is no rash, tenderness, lesion or injury on the left labia. Cervix exhibits no motion tenderness, no discharge and no friability. No tenderness (but mild erythema still) or bleeding in the vagina. No foreign body around the vagina. No vaginal discharge found.  Lymphadenopathy:       Right: No inguinal adenopathy present.       Left: No inguinal adenopathy present.  Skin: Skin is warm and dry.  Psychiatric: She has a normal mood and affect.       Assessment:     Vaginal irritation  External hemorrhoids     Plan:     Repeat affirm Anusol HC Premarin vaginal cream 1/2 gram pv twice weekly Pt to call if still symptomatic after four weeks

## 2014-09-27 NOTE — Telephone Encounter (Signed)
Pt states she saw Dr [name] a week ago and is still having symptoms. Pt would like to know if she needs to have office visit or refill on medication.  Pharmacy: [facility]

## 2014-09-27 NOTE — Telephone Encounter (Signed)
Spoke with patient. Treated with Flagyl for BV and patient states symptoms are greatly improved, however, she continues to have vaginal irritation in vulvar area, states that it remains red and raw despite use of hydrocortisone suppositories.  Advised patient may need additional time for healing, however, patient feels she would like evaluation with Dr. [name] just to be sure. Appointment scheduled for today at 1045 with Dr. [name], patient agreeable. Routing to provider for final review. Patient agreeable to disposition. Will close encounter

## 2014-09-28 ENCOUNTER — Telehealth: Payer: Self-pay | Admitting: Obstetrics & Gynecology

## 2014-09-28 LAB — WET PREP BY MOLECULAR PROBE
Candida species: NEGATIVE
Gardnerella vaginalis: POSITIVE — AB
Trichomonas vaginosis: NEGATIVE

## 2014-09-28 NOTE — Telephone Encounter (Signed)
Called Kathlee Nations back she states "it has been fixed" so disregard.

## 2014-09-28 NOTE — Telephone Encounter (Signed)
Alicia Alvarez from [facility] calling requesting to change a recent prescription.

## 2014-09-30 ENCOUNTER — Encounter: Payer: Self-pay | Admitting: Medical

## 2014-09-30 ENCOUNTER — Ambulatory Visit (INDEPENDENT_AMBULATORY_CARE_PROVIDER_SITE_OTHER): Payer: 59 | Admitting: Medical

## 2014-09-30 VITALS — BP 116/76 | HR 58 | Temp 98.0°F | Resp 15 | Wt 143.0 lb

## 2014-09-30 DIAGNOSIS — M62830 Muscle spasm of back: Secondary | ICD-10-CM | POA: Diagnosis not present

## 2014-09-30 DIAGNOSIS — H68013 Acute Eustachian salpingitis, bilateral: Secondary | ICD-10-CM

## 2014-09-30 DIAGNOSIS — M7711 Lateral epicondylitis, right elbow: Secondary | ICD-10-CM | POA: Diagnosis not present

## 2014-09-30 MED ORDER — MOMETASONE FUROATE 50 MCG/ACT NA SUSP: 2.0000 | Freq: Every day | NASAL | Status: DC

## 2014-09-30 NOTE — Patient Instructions (Signed)
Massage Therapy:  [facility] [address] [place], [place] [address] [phone] [email or web address]   [facility] [address] [place], [place] [phone]

## 2014-09-30 NOTE — Progress Notes (Signed)
Subjective: Here for multiple c/o.  Has hx/o ear wax.  Left ear lately hurting some.   Both ears feel full and itchy.  Denies other URI symptoms, more of an aggravation.  Worried more about fluid vs ear wax.  Using nothing particularly for this.  Has used Nasonex in the past for same.  Not usually having allergy problems this time of year.  Right elbow has been painful for several months.  Wakes her up in the sleep at times, not necessarily related to activity.  Wonders about tendonitis vs arthritis.  Laid off swimming for a month.  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   Does yoga, swims, runs regularly.   No prior tendonitis.   No recent fall, injury, trauma.    Feels some pulled muscles in right shoulder.  Using ibuprofen.  No particular injury.  Has had pain for 3-4 weeks.  Not tried ice or heat.  Not on computer a lot.   Does yoga daily.   No neck pain, no paresthesias but does get ache down whole right arm at times.    No other aggravating or relieving factors. No other complaint.   Past Medical History  Diagnosis Date  . Ankle fracture     right   ROS as in subjective  Objective: BP 116/76 mmHg  Pulse 58  Temp(Src) 98 F (36.7 C) (Oral)  Resp 15  Wt 143 lb (64.864 kg)  LMP 07/30/2002  Gen: wd, wn, nad Skin no erythema or ecchymosis HENT - mildly retracted bilat TMs, minimal wax, no erythema, rest of HENT unremarkable Neck: supple, nontender, normal ROM, no mass, no organomegaly MSK: tender over right lateral epcondyle, otherwise arm nontender, rest of bilat arm exam unremarkable Back: tender bilat upper paraspinal region, spasm on right upper area including rhomboids, otherwise back nontender Arms neurovascularly intact   Assessment: Encounter Diagnoses  Name Primary?  . Eustachian salpingitis, acute, bilateral Yes  . Lateral epicondylitis (tennis elbow), right   . Spasm of back muscles     Plan: Eustachian  salpingitis - begin Nasonex mornings, OTC Benadryl QHS x 2-3 wk, f/u if not improving, c/t good water intake  lateral epicondylitis - advised relative rest, can use arm sling few hours throughout the day, ice, and will use Ibuprofen OTC 3 tablets BID x 1-2 weeks.   Can consider tennis elbow strap  Spasm of back - advised heat, massage, c/t swimming and yoga, ibuprofen x 1-2 weeks as above.  F/u if not improving in 2wk, and call back if desiring the massage therapy referral she inquired about.

## 2014-10-01 ENCOUNTER — Encounter: Payer: Self-pay | Admitting: Obstetrics & Gynecology

## 2014-10-05 ENCOUNTER — Other Ambulatory Visit: Payer: Self-pay

## 2014-10-05 NOTE — Telephone Encounter (Signed)
-----   Message from Megan Salon, MD sent at 10/04/2014  9:34 PM EST ----- Inform pt Affirm was still + for bacterial vaginosis.  She took Flagyl but had a hard time with it.  I don't want her to take anything else yet and only if she is still having symptoms in another two weeks do I want to retreat.  Can she give an update in two weeks?  Thanks.

## 2014-10-05 NOTE — Telephone Encounter (Signed)
Lmtcb//kn 

## 2014-10-06 NOTE — Telephone Encounter (Signed)
Lmtcb//kn 

## 2014-10-06 NOTE — Telephone Encounter (Signed)
Patient is returning a call to Kelly °

## 2014-10-11 NOTE — Telephone Encounter (Signed)
Patient notified of results and information from Dr [name] re: treatment. Rx for Clindamycin vaginal cream sent to pharmacy. Patient aware I will call her on Thursday morning to see how she is doing, since she will be going out of the country this weekend.//kn Dr [name], will you make sure this Rx is correct//kn

## 2014-10-12 ENCOUNTER — Telehealth: Payer: Self-pay | Admitting: Obstetrics & Gynecology

## 2014-10-12 MED ORDER — CLINDAMYCIN PHOSPHATE 2 % VA CREA: 1.0000 | TOPICAL_CREAM | Freq: Every day | VAGINAL | Status: DC

## 2014-10-12 NOTE — Telephone Encounter (Signed)
Rx printed/see if DL will sign//kn

## 2014-10-12 NOTE — Telephone Encounter (Signed)
Patient spoke with Maryann Conners, CMA this morning regarding symptoms and medication. Please see result note below.  Notes Recorded by Robley Fries, CMA on 10/12/2014 at 8:14 AM Spoke with patient-all information given/Rx sent to pharmacy-see phone note.//kn Notes Recorded by Megan Salon, MD on 10/11/2014 at 11:26 AM Please check on pt. If still not completely back to normal, I want to treat with clindamycin vaginal cream. She did not tolerate the metronidazole very well. Clindamycin 2% vaginal cream 1 applicator nightly for 7 nights. Thanks.  Routing to provider for final review. Patient agreeable to disposition. Will close encounter

## 2014-10-12 NOTE — Telephone Encounter (Signed)
Ms Matherly states she was speaking to [facility] late yesterday and [name] said she would get pt scheduled with Dr [name] asap for a specific problem. Pt was told to call first thing this morning. Kaitlyn not in yet.

## 2014-10-12 NOTE — Telephone Encounter (Signed)
Spoke with patient this morning-states she is feeling much better and does not want to start a new medication in case it irritates area again. Feels like some of the discharge was the premarin and slight irritation from intercourse, but today is great. Will call if any flares before leaving to go out of the country this weekend, in hopes that she can be seen.//kn

## 2014-11-03 ENCOUNTER — Ambulatory Visit (INDEPENDENT_AMBULATORY_CARE_PROVIDER_SITE_OTHER): Payer: 59 | Admitting: Family Medicine

## 2014-11-03 ENCOUNTER — Encounter: Payer: Self-pay | Admitting: Family Medicine

## 2014-11-03 VITALS — BP 124/78 | HR 70 | Temp 97.6°F | Wt 143.6 lb

## 2014-11-03 DIAGNOSIS — A09 Infectious gastroenteritis and colitis, unspecified: Secondary | ICD-10-CM

## 2014-11-03 MED ORDER — CIPROFLOXACIN HCL 500 MG PO TABS: 500.0000 mg | ORAL_TABLET | Freq: Two times a day (BID) | ORAL | Status: DC

## 2014-11-03 NOTE — Progress Notes (Signed)
   Subjective:    Patient ID: Alicia Alvarez, female    DOB: 08-24-1950, 64 y.o.   MRN: [id number]  HPI She is here for an 8 day history of stomach cramping and loose stools. These symptoms began the day she returned from a 10 day trip to [place]. She reports an average of 3-4 loose stools per day and states she has a decreased level of energy. Denies fever, chills, nausea, vomiting, blood or pus in stool. She has been taking probiotics.    Review of Systems     Objective:   Physical Exam  Alert and in no distress. Mouth mucous membranes moist, Throat clear. Abdomen soft, non distended, bowel sounds are hyperactive, tenderness to epigastric region, no guarding or rebound, no hepatosplenomegaly.        Assessment & Plan:  Acute infective gastroenteritis - Plan: ciprofloxacin (CIPRO) 500 MG tablet  Discussed that her symptoms are most likely due to a bacteria and will treat with a 3 day course of Cipro. Suggested she continue taking probiotics, hydrating and eating her normal diet. She will let me know how she is doing in a few days.

## 2015-02-01 ENCOUNTER — Ambulatory Visit (INDEPENDENT_AMBULATORY_CARE_PROVIDER_SITE_OTHER): Payer: 59 | Admitting: Family Medicine

## 2015-02-01 ENCOUNTER — Encounter: Payer: Self-pay | Admitting: Family Medicine

## 2015-02-01 VITALS — BP 116/74 | HR 70 | Ht 67.0 in | Wt 143.0 lb

## 2015-02-01 DIAGNOSIS — M7712 Lateral epicondylitis, left elbow: Secondary | ICD-10-CM | POA: Diagnosis not present

## 2015-02-01 DIAGNOSIS — M7711 Lateral epicondylitis, right elbow: Secondary | ICD-10-CM | POA: Diagnosis not present

## 2015-02-01 DIAGNOSIS — Z1211 Encounter for screening for malignant neoplasm of colon: Secondary | ICD-10-CM | POA: Diagnosis not present

## 2015-02-01 NOTE — Patient Instructions (Signed)
Heat to the area 3 times per day and as much is 800 mg of ibuprofen 3 times per day. Try and keep your wrists in a neutral position or slightly flexed

## 2015-02-01 NOTE — Progress Notes (Signed)
   Subjective:    Patient ID: Alicia Alvarez, female    DOB: 1951/05/29, 64 y.o.   MRN: [id number]  HPI She complains of a several month history of difficulty with bilateral lateral elbow pain. 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 She does not note any other activity. She's not had any undue skull activity.   Review of Systems     Objective:   Physical Exam Alert and in no distress. Slight tenderness palpation over the lateral epicondyle bilaterally with provocative testing causing pain.       Assessment & Plan:  Special screening for malignant neoplasms, colon - Plan: Ambulatory referral to Gastroenterology  Lateral epicondylitis of both elbows Recommend she do as many things that she can palms up and open. Also suggested that she keep her hands in the neutral position or slightly flexed when she is doing her therapy. Also heat and anti-inflammatory.If no improvement we can consider injections but both she and I are not interested in that right now. Review of her record indicates she needs a colonoscopy as well as mammogram. She will set up the mammogram and is scheduled to see her gynecologist next several weeks.

## 2015-02-22 ENCOUNTER — Ambulatory Visit (INDEPENDENT_AMBULATORY_CARE_PROVIDER_SITE_OTHER): Payer: 59 | Admitting: Family Medicine

## 2015-02-22 ENCOUNTER — Encounter: Payer: Self-pay | Admitting: Family Medicine

## 2015-02-22 ENCOUNTER — Other Ambulatory Visit: Payer: Self-pay

## 2015-02-22 DIAGNOSIS — M791 Myalgia: Secondary | ICD-10-CM

## 2015-02-22 DIAGNOSIS — M7918 Myalgia, other site: Secondary | ICD-10-CM

## 2015-02-22 MED ORDER — CARISOPRODOL 350 MG PO TABS: 350.0000 mg | ORAL_TABLET | Freq: Four times a day (QID) | ORAL | Status: DC | PRN
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
## 2015-02-22 NOTE — Patient Instructions (Signed)
Continue with heat 20 minutes 3 times per day; do stretching after you do the heat every. He can take 800 mg of ibuprofen 3 times per day.

## 2015-02-24 ENCOUNTER — Encounter: Payer: Self-pay | Admitting: Obstetrics and Gynecology

## 2015-02-24 ENCOUNTER — Ambulatory Visit (INDEPENDENT_AMBULATORY_CARE_PROVIDER_SITE_OTHER): Payer: 59 | Admitting: Obstetrics and Gynecology

## 2015-02-24 VITALS — BP 118/78 | HR 64 | Resp 16 | Ht 66.5 in | Wt 143.0 lb

## 2015-02-24 DIAGNOSIS — N951 Menopausal and female climacteric states: Secondary | ICD-10-CM

## 2015-02-24 DIAGNOSIS — Z0000 Encounter for general adult medical examination without abnormal findings: Secondary | ICD-10-CM | POA: Diagnosis not present

## 2015-02-24 DIAGNOSIS — Z01419 Encounter for gynecological examination (general) (routine) without abnormal findings: Secondary | ICD-10-CM | POA: Diagnosis not present

## 2015-02-24 DIAGNOSIS — Z1211 Encounter for screening for malignant neoplasm of colon: Secondary | ICD-10-CM

## 2015-02-24 MED ORDER — PROGESTERONE MICRONIZED 100 MG PO CAPS: 100.0000 mg | ORAL_CAPSULE | Freq: Every day | ORAL | Status: DC

## 2015-02-24 MED ORDER — ESTRADIOL 0.0375 MG/24HR TD PTTW: 1.0000 | MEDICATED_PATCH | TRANSDERMAL | Status: DC
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
## 2015-02-25 LAB — COMPREHENSIVE METABOLIC PANEL
ALK PHOS: 52 U/L (ref 33–130)
ALT: 16 U/L (ref 6–29)
AST: 17 U/L (ref 10–35)
Albumin: 4.3 g/dL (ref 3.6–5.1)
BILIRUBIN TOTAL: 0.6 mg/dL (ref 0.2–1.2)
BUN: 11 mg/dL (ref 7–25)
CALCIUM: 9.6 mg/dL (ref 8.6–10.4)
CO2: 25 mEq/L (ref 20–31)
CREATININE: 0.74 mg/dL (ref 0.50–0.99)
Chloride: 99 mEq/L (ref 98–110)
GLUCOSE: 76 mg/dL (ref 65–99)
POTASSIUM: 4.3 meq/L (ref 3.5–5.3)
Sodium: 135 mEq/L (ref 135–146)
Total Protein: 6.7 g/dL (ref 6.1–8.1)

## 2015-02-25 LAB — LIPID PANEL
CHOL/HDL RATIO: 1.8 ratio (ref <=5.0)
Cholesterol: 209 mg/dL — ABNORMAL HIGH (ref 125–200)
HDL: 113 mg/dL (ref >=46)
LDL Cholesterol: 79 mg/dL (ref <130)
Triglycerides: 84 mg/dL (ref <150)
VLDL: 17 mg/dL (ref <30)

## 2015-02-25 LAB — VITAMIN D 25 HYDROXY (VIT D DEFICIENCY, FRACTURES): Vit D, 25-Hydroxy: 29 ng/mL — ABNORMAL LOW (ref 30–100)

## 2015-02-28 LAB — HEMOGLOBIN, FINGERSTICK: HEMOGLOBIN, FINGERSTICK: 13.1 g/dL (ref 12.0–16.0)

## 2015-04-12 ENCOUNTER — Ambulatory Visit (INDEPENDENT_AMBULATORY_CARE_PROVIDER_SITE_OTHER): Payer: 59 | Admitting: Family Medicine

## 2015-04-12 VITALS — BP 100/64 | HR 78 | Ht 67.0 in | Wt 143.0 lb

## 2015-04-12 DIAGNOSIS — M791 Myalgia: Secondary | ICD-10-CM

## 2015-04-12 DIAGNOSIS — M7918 Myalgia, other site: Secondary | ICD-10-CM
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
## 2015-05-04 ENCOUNTER — Encounter: Payer: Self-pay | Admitting: Obstetrics & Gynecology

## 2015-05-04 ENCOUNTER — Ambulatory Visit (INDEPENDENT_AMBULATORY_CARE_PROVIDER_SITE_OTHER): Payer: 59 | Admitting: Obstetrics & Gynecology

## 2015-05-04 VITALS — BP 100/62 | HR 76 | Resp 14 | Wt 142.0 lb

## 2015-05-04 DIAGNOSIS — R300 Dysuria: Secondary | ICD-10-CM | POA: Diagnosis not present

## 2015-05-04 DIAGNOSIS — N898 Other specified noninflammatory disorders of vagina: Secondary | ICD-10-CM

## 2015-05-04 DIAGNOSIS — N949 Unspecified condition associated with female genital organs and menstrual cycle: Secondary | ICD-10-CM | POA: Diagnosis not present

## 2015-05-04 DIAGNOSIS — N9489 Other specified conditions associated with female genital organs and menstrual cycle: Secondary | ICD-10-CM

## 2015-05-04 LAB — POCT URINALYSIS DIPSTICK
Bilirubin, UA: NEGATIVE
Blood, UA: NEGATIVE
Glucose, UA: NEGATIVE
Ketones, UA: NEGATIVE
Leukocytes, UA: NEGATIVE
NITRITE UA: NEGATIVE
Protein, UA: NEGATIVE
Urobilinogen, UA: NEGATIVE
pH, UA: 5

## 2015-05-04 NOTE — Progress Notes (Signed)
Subjective:     Patient ID: Alicia Alvarez, female   DOB: [birth year], 64 y.o.   MRN: [id number]  HPI 64 yo G3P1A2 DWf here for complaint of vaginal discharge with itching.  Used a three day OTC treatment, generic, and pt reports this "cleared it right up".  Then three days ago she started experiencing burning of the vulva.    Pt reports her partner also has a small raised area on the penis.  She reports he has very sensitive skin and area resolved within a day.  Felt like she should mention is but she isn't really concerned about this.  Also reports she tried Premarin cream x 1 and reports this didn't really help at all.  Denies fever, urinary symptoms, bowel habit changes.  Denies VB.  Review of Systems  All other systems reviewed and are negative.      Objective:   Physical Exam  Constitutional: She appears well-developed and well-nourished.  Abdominal: Soft. Bowel sounds are normal.  Genitourinary: Uterus normal.    There is no rash, tenderness or lesion on the right labia. There is no rash, tenderness or lesion on the left labia. Cervix exhibits no motion tenderness, no discharge and no friability. There is tenderness (in area noted with erythema in picture) in the vagina. No bleeding in the vagina. No foreign body around the vagina. No signs of injury around the vagina.  Lymphadenopathy:       Right: No inguinal adenopathy present.       Left: No inguinal adenopathy present.  Skin: Skin is warm and dry.  Psychiatric: She has a normal mood and affect.       Assessment:     Vaginitis, possible yeast vs BV vs atrophic changes     Plan:     Affirm pending.  For now, pt will use premarin cream externally for symptom reliev.  Results will be called and recommendations made at that point.

## 2015-05-05 ENCOUNTER — Telehealth: Payer: Self-pay | Admitting: Obstetrics & Gynecology

## 2015-05-05 ENCOUNTER — Telehealth: Payer: Self-pay | Admitting: Family Medicine

## 2015-05-05 LAB — WET PREP BY MOLECULAR PROBE
Candida species: NEGATIVE
Gardnerella vaginalis: NEGATIVE
TRICHOMONAS VAG: NEGATIVE

## 2015-05-05 NOTE — Telephone Encounter (Signed)
Call to patient. She is given message from Dr. [name]. She confirms she does have premarin cream at home and will use as directed per Dr. [name].  She declines at this time to schedule follow up, will call back to do so.  Routing to provider for final review. Patient agreeable to disposition. Will close encounter.

## 2015-05-05 NOTE — Telephone Encounter (Signed)
Pt would like to go ahead and start the physical therapy and needs referral  She will check and see which one is close to her work and call back with the info

## 2015-05-05 NOTE — Telephone Encounter (Signed)
-----   Message from Megan Salon, MD sent at 05/05/2015  2:41 PM EDT ----- Please inform pt affirm is negative.  Needs to use premarin vaginal cream (I think she has some although not on medication list) 1/2 gm pv twice weekly.  Follow up 4-6 weeks.

## 2015-05-05 NOTE — Telephone Encounter (Signed)
Patient calling for lab results from 05/04/15. She said, "I was told the results would be back this morning so I was hoping someone could call me with those."

## 2015-05-05 NOTE — Telephone Encounter (Signed)
Routing to Dr. [name] for recommendations and instructions for negative results.

## 2015-05-05 NOTE — Telephone Encounter (Signed)
Just sent the results to you.  Thanks.

## 2015-05-11 NOTE — Telephone Encounter (Signed)
I HAVE FAXED REFERRAL TO NUMBER PROVIDED

## 2015-05-11 NOTE — Telephone Encounter (Signed)
Set this up 

## 2015-05-11 NOTE — Telephone Encounter (Signed)
Pt called back & wants referral to go to ATTN: Serita Kyle at [facility] fax# [phone]

## 2015-05-13 ENCOUNTER — Other Ambulatory Visit: Payer: Self-pay | Admitting: Medical

## 2015-05-13 ENCOUNTER — Telehealth: Payer: Self-pay

## 2015-05-13 MED ORDER — VALACYCLOVIR HCL 1 G PO TABS: ORAL_TABLET | ORAL | Status: DC

## 2015-05-13 NOTE — Telephone Encounter (Signed)
Error

## 2015-05-13 NOTE — Telephone Encounter (Addendum)
Needs a renewal on her Valacyclovie (Valtrex) 1,000mg  #12 taking 2 a day for 1 day. Would like it called in to the [facility] in [place]. If possible, she would like it today.

## 2015-05-13 NOTE — Telephone Encounter (Signed)
Done, need CPX appt too

## 2015-05-13 NOTE — Telephone Encounter (Signed)
We renewed her Valtrex and she has had her physical with her OBGYN so she doesn't need one here.

## 2015-05-17 NOTE — Telephone Encounter (Signed)
Pt had CPE with GYN & doesn't want to have another one here,  [employer] had told her she could have either place

## 2015-05-26 ENCOUNTER — Encounter: Payer: Self-pay | Admitting: Physical Therapy

## 2015-05-26 ENCOUNTER — Ambulatory Visit: Payer: 59 | Attending: Family Medicine | Admitting: Physical Therapy

## 2015-05-26 DIAGNOSIS — M542 Cervicalgia: Secondary | ICD-10-CM | POA: Insufficient documentation

## 2015-05-26 NOTE — Therapy (Signed)
[place] MAIN [facility] SERVICES [address] [place], [place], [address] Phone: [phone]   Fax:  [phone]  Physical Therapy Evaluation  Patient Details  Name: Alicia Alvarez MRN: [id number] Date of Birth: December 31, 1950 Referring Provider: Jill Alexanders  Encounter Date: 05/26/2015      PT End of Session - 05/26/15 1612    Visit Number 1   Number of Visits 9   Date for PT Re-Evaluation 07/21/15   PT Start Time 0330   PT Stop Time 0430   PT Time Calculation (min) 60 min   Activity Tolerance Patient tolerated treatment well;No increased pain   Behavior During Therapy [facility] for tasks assessed/performed      Past Medical History  Diagnosis Date  . Ankle fracture     right    Past Surgical History  Procedure Laterality Date  . Cesarean section      There were no vitals filed for this visit.  Visit Diagnosis:  Neck pain      Subjective Assessment - 05/26/15 1526    Subjective Patient is having neck pain that is constant, day and night.    Currently in Pain? Yes   Pain Score 6    Pain Location Neck            [facility] PT Assessment - 05/26/15 0001    Assessment   Medical Diagnosis neck pain   Referring Provider lalonde john   Onset Date/Surgical Date 01/28/15   Hand Dominance Right   Balance Screen   Has the patient fallen in the past 6 months No   Has the patient had a decrease in activity level because of a fear of falling?  Yes   Is the patient reluctant to leave their home because of a fear of falling?  No   Home Ecologist residence   Living Arrangements Spouse/significant other   Available Help at Discharge Family   Type of [place] to enter   Entrance Stairs-Number of Steps 3   Entrance Stairs-Rails None   Home Layout Two level   [place] None   Prior Function   Level of Independence Independent   Vocation Part time employment       PAIN: 3/10  to -7/10 constant in neck  Cervical AROM:  SBR  20 SBL  20 Rot L 50 Rot R 40 Ext  40 Flex  35   POSTURE:  WFL   :  STRENGTH:  Graded on a 0-5 scale Muscle Group Left Right  Shoulder flex 5 5  Shoulder abd 5 5  Shoulder Ext 5 5  Shoulder IR/ER 5 5  Elbow 5 5  Wrist/hand 5 5  Hip Flex 5 5  Hip Abd    Hip Add    Hip Ext    Hip IR/ER    Knee Flex    Knee Ext    Ankle DF    Ankle PF     SENSATION: intact   SPECIAL TESTS: + spring test   Palpation : tender to palpation C5- 7, T1-7                               PT Education - 05/26/15 1612    Education provided Yes   Person(s) Educated Patient   Methods Explanation   Comprehension Verbalized understanding  PT Long Term Goals - 05/26/15 1617    PT LONG TERM GOAL #1   Title Patient will report a worst pain of 3/10 on VAS in neck            to improve tolerance with ADLs and reduced symptoms with activities   Time 8   Period Weeks   Status New   PT LONG TERM GOAL #2   Title Patient will be able to perform household work/ chores without increase in symptoms   Time 8   Period Weeks   Status New   PT LONG TERM GOAL #3   Title Patient will improve AROM neck so they are able to perform work related functions with less pain   Time 8   Period Weeks   Status New               Plan - 05/26/15 1614    Clinical Impression Statement Patient is 64 yr old female with constant neck pain ranging from 3/10 - 8/10. She has decreased ROM and difficulty with reading, driving and  household chores., lifting, working.    Pt will benefit from skilled therapeutic intervention in order to improve on the following deficits Pain;Decreased mobility;Decreased range of motion;Decreased activity tolerance   Rehab Potential Good   PT Frequency 2x / week   PT Duration 8 weeks   PT Treatment/Interventions Cryotherapy;Electrical Stimulation;Moist Heat;Traction;Ultrasound;Manual  techniques;Therapeutic exercise;Therapeutic activities   PT Next Visit Plan modalities and manual therpay   Consulted and Agree with Plan of Care Patient         Problem List There are no active problems to display for this patient.   Alanson Puls 05/26/2015, 4:40 PM  [place] MAIN [facility] SERVICES [address] [place], [place], [address] Phone: [phone]   Fax:  [phone]  Name: Alicia Alvarez MRN: [id number] Date of Birth: 1950-09-02

## 2015-06-08 ENCOUNTER — Ambulatory Visit: Payer: 59 | Attending: Family Medicine | Admitting: Physical Therapy

## 2015-06-08 ENCOUNTER — Encounter: Payer: Self-pay | Admitting: Physical Therapy

## 2015-06-08 DIAGNOSIS — M542 Cervicalgia: Secondary | ICD-10-CM | POA: Insufficient documentation

## 2015-06-08 NOTE — Therapy (Signed)
[place] MAIN [facility] SERVICES [address] [place], [place], [address] Phone: [phone]   Fax:  [phone]  Physical Therapy Treatment  Patient Details  Name: Alicia Alvarez MRN: [id number] Date of Birth: May 30, 1951 Referring Provider: Jill Alexanders  Encounter Date: 06/08/2015      PT End of Session - 06/08/15 1651    Visit Number 2   Number of Visits 9   Date for PT Re-Evaluation 07/21/15   PT Start Time 0415   PT Stop Time 0500   PT Time Calculation (min) 45 min   Activity Tolerance Patient tolerated treatment well;No increased pain   Behavior During Therapy [facility] for tasks assessed/performed      Past Medical History  Diagnosis Date  . Ankle fracture     right    Past Surgical History  Procedure Laterality Date  . Cesarean section      There were no vitals filed for this visit.  Visit Diagnosis:  Neck pain      Subjective Assessment - 06/08/15 1615    Subjective Patient is having neck pain that is constant, day and night. She says it is a 5/10 today.    Pain Score 5    Pain Location Neck      Pt has 5/10 prior to treatment and responds to [place] at 1.5 cm sq x 10 minutes, e-stim IFc crossed pattern to left thoracic musculature, manual therapy PA glides grade 2 and 3 C7, T1-T4 x 30 bouts x 3, STM to scapula musculature .  Patient has pain relief following therapy.  HEP was reviewed and patient instructed to continue with ice and heat to reduce pain.                            PT Education - 06/08/15 1651    Education provided Yes   Education Details heat and ice   Person(s) Educated Patient   Methods Explanation   Comprehension Verbalized understanding             PT Long Term Goals - 05/26/15 1617    PT LONG TERM GOAL #1   Title Patient will report a worst pain of 3/10 on VAS in neck            to improve tolerance with ADLs and reduced symptoms with activities   Time 8   Period  Weeks   Status New   PT LONG TERM GOAL #2   Title Patient will be able to perform household work/ chores without increase in symptoms   Time 8   Period Weeks   Status New   PT LONG TERM GOAL #3   Title Patient will improve AROM neck so they are able to perform work related functions with less pain   Time 8   Period Weeks   Status New               Plan - 06/08/15 1651    Clinical Impression Statement Patient is having less pain today in her thoracic spine 5/10 and responds to [place], e-stim and MH and manual therapy to thoracic spine.    Pt will benefit from skilled therapeutic intervention in order to improve on the following deficits Pain;Decreased mobility;Decreased range of motion;Decreased activity tolerance   Rehab Potential Good   PT Frequency 2x / week   PT Duration 8 weeks   PT Treatment/Interventions Cryotherapy;Electrical Stimulation;Moist Heat;Traction;Ultrasound;Manual techniques;Therapeutic exercise;Therapeutic activities  PT Next Visit Plan modalities and manual therpay   Consulted and Agree with Plan of Care Patient        Problem List There are no active problems to display for this patient.   Alanson Puls 06/08/2015, 4:53 PM  [place] MAIN [facility] SERVICES [address] [place], [place], [address] Phone: [phone]   Fax:  [phone]  Name: Alicia Alvarez MRN: [id number] Date of Birth: June 04, 1951

## 2015-06-13 ENCOUNTER — Ambulatory Visit: Payer: 59 | Admitting: Physical Therapy

## 2015-06-13 ENCOUNTER — Encounter: Payer: Self-pay | Admitting: Physical Therapy

## 2015-06-13 DIAGNOSIS — M542 Cervicalgia: Secondary | ICD-10-CM | POA: Diagnosis not present

## 2015-06-13 NOTE — Therapy (Signed)
[place] MAIN [facility] SERVICES [address] [place], [place], [address] Phone: [phone]   Fax:  [phone]  Physical Therapy Treatment  Patient Details  Name: Alicia Alvarez MRN: [id number] Date of Birth: 1951/03/26 Referring Provider: Jill Alexanders  Encounter Date: 06/13/2015      PT End of Session - 06/13/15 1801    Visit Number 3   Number of Visits 9   Date for PT Re-Evaluation 07/21/15   PT Start Time 0545   PT Stop Time 0615   PT Time Calculation (min) 30 min   Activity Tolerance Patient tolerated treatment well;No increased pain   Behavior During Therapy [facility] for tasks assessed/performed      Past Medical History  Diagnosis Date  . Ankle fracture     right    Past Surgical History  Procedure Laterality Date  . Cesarean section      There were no vitals filed for this visit.  Visit Diagnosis:  Neck pain      Subjective Assessment - 06/13/15 1759    Subjective Patient is having neck pain that is constant, day and night. She says it is a 4/10 today.    Pain Score 4       Pt has 4/10 prior to treatment and responds to e-stim IFc crossed pattern to left thoracic musculature, manual therapy PA glides grade 2 and 3 C7, T1-T4 x 30 bouts x 3, STM to scapula musculature .  Patient has pain relief following therapy.  HEP was reviewed and patient instructed to continue with ice and heat to reduce pain                           PT Education - 06/13/15 1800    Education provided Yes   Education Details heat  and ice   Person(s) Educated Patient   Methods Explanation   Comprehension Verbalized understanding             PT Long Term Goals - 05/26/15 1617    PT LONG TERM GOAL #1   Title Patient will report a worst pain of 3/10 on VAS in neck            to improve tolerance with ADLs and reduced symptoms with activities   Time 8   Period Weeks   Status New   PT LONG TERM GOAL #2   Title  Patient will be able to perform household work/ chores without increase in symptoms   Time 8   Period Weeks   Status New   PT LONG TERM GOAL #3   Title Patient will improve AROM neck so they are able to perform work related functions with less pain   Time 8   Period Weeks   Status New               Plan - 06/13/15 1801    Clinical Impression Statement Patient continues to have neck and thoracic pain 4/10 that is constant. She was walking her dog and had a sharpe pain that made it worse again.    Pt will benefit from skilled therapeutic intervention in order to improve on the following deficits Pain;Decreased mobility;Decreased range of motion;Decreased activity tolerance   Rehab Potential Good   PT Frequency 2x / week   PT Duration 8 weeks   PT Treatment/Interventions Cryotherapy;Electrical Stimulation;Moist Heat;Traction;Ultrasound;Manual techniques;Therapeutic exercise;Therapeutic activities   PT Next Visit Plan modalities and manual therpay  Consulted and Agree with Plan of Care Patient        Problem List There are no active problems to display for this patient.   Alanson Puls 06/13/2015, 6:03 PM  [place] MAIN [facility] SERVICES [address] [place], [place], [address] Phone: [phone]   Fax:  [phone]  Name: Alicia Alvarez MRN: [id number] Date of Birth: [birth year]

## 2015-06-15 ENCOUNTER — Encounter: Payer: 59 | Admitting: Physical Therapy

## 2015-06-27 ENCOUNTER — Ambulatory Visit: Payer: 59

## 2015-06-27 DIAGNOSIS — M542 Cervicalgia: Secondary | ICD-10-CM | POA: Diagnosis not present

## 2015-06-28 NOTE — Therapy (Signed)
[place] MAIN [facility] SERVICES [address] [place], [place], [address] Phone: [phone]   Fax:  [phone]  Physical Therapy Treatment  Patient Details  Name: Alicia Alvarez MRN: [id number] Date of Birth: 1951/07/25 Referring Provider: Jill Alexanders  Encounter Date: 06/27/2015      PT End of Session - 06/28/15 1009    Visit Number 4   Number of Visits 9   Date for PT Re-Evaluation 07/21/15   PT Start Time 5188   PT Stop Time 1835   PT Time Calculation (min) 50 min   Activity Tolerance Patient tolerated treatment well;No increased pain   Behavior During Therapy [facility] for tasks assessed/performed      Past Medical History  Diagnosis Date  . Ankle fracture     right    Past Surgical History  Procedure Laterality Date  . Cesarean section      There were no vitals filed for this visit.  Visit Diagnosis:  Neck pain      Subjective Assessment - 06/28/15 1005    Subjective pt reports her neck pain is about the same if not worse due to driving for the holiday. pt reports 6/10 pain with movement. pt denies any numbness/tingling or arm pain.    Currently in Pain? Yes   Pain Score 6    Pain Location --  posterior and L/R neck      Treatment: Therex: PT reassessed ROM: Pt has posterior neck pain with cervical flexion lacking 1inch from chest Pt has no pain with extension with 25% limitation Pt has pain to same side of neck when sidebending, R 25 deg, L 30 deg Rotation is limited 50% to the R and 25% to the L Pt has no peripheral symptoms Increased tone to neck musculature R >L side Hypomobile cervical and thoracic spine Discussed diaphragmatic breathing to limit accessory cervical muscle strain (IE scalenes) Low row: yellow band 3x10  Chin tuck and lift 5s x 10 Cervical retraction in sitting x 10 Upper trap stretch 30sx 2 Pt requires min verbal and tactile cues for proper exercise performance  Manual therapyL Extensive  Soft tissue massage and ischemic trigger point release to bilateral neck musculature, upper trap, scalenes, levator scap, and SCM Sub occipital release x 5 min intermittant manual traction 20s hold x 10 Pt had much improved ROM and less pain following therapy.                             PT Education - 06/28/15 1008    Education provided Yes   Education Details HEP, pain and stages of healing.    Person(s) Educated Patient   Methods Explanation   Comprehension Verbalized understanding             PT Long Term Goals - 06/28/15 1010    PT LONG TERM GOAL #1   Title Patient will report a worst pain of 3/10 on VAS in neck            to improve tolerance with ADLs and reduced symptoms with activities   Time 8   Period Weeks   Status Partially Met   PT LONG TERM GOAL #2   Title Patient will be able to perform household work/ chores without increase in symptoms   Time 8   Period Weeks   Status On-going   PT LONG TERM GOAL #3   Title Patient will improve AROM neck  so they are able to perform work related functions with less pain   Time 8   Period Weeks   Status On-going               Plan - 06/28/15 1009    Clinical Impression Statement pts pain is primarily musclar. pt has multiple trigger points in upper trap, sub occipitals, SCM which responded well to soft tissue work today. pt had much improved ROM following therapy. progressed HEP for improved cervical flexibilitly and deep neck flexor strengthening as well as scapular strengthening.    Pt will benefit from skilled therapeutic intervention in order to improve on the following deficits Pain;Decreased mobility;Decreased range of motion;Decreased activity tolerance   Rehab Potential Good   PT Frequency 2x / week   PT Duration 8 weeks   PT Treatment/Interventions Cryotherapy;Electrical Stimulation;Moist Heat;Traction;Ultrasound;Manual techniques;Therapeutic exercise;Therapeutic activities   PT Next  Visit Plan modalities and manual therpay   Consulted and Agree with Plan of Care Patient        Problem List There are no active problems to display for this patient.  Gorden Harms. Reah Justo, PT, DPT [phone]  Alicia Alvarez 06/28/2015, 10:11 AM  [place] MAIN [facility] SERVICES [address] [place], [place], [address] Phone: [phone]   Fax:  [phone]  Name: Alicia Alvarez MRN: [id number] Date of Birth: 1951/05/08

## 2015-06-28 NOTE — Patient Instructions (Signed)
[email or web address]: Chin tucks x 10 3-5xday Supine chin tuck and lift 5s x 10 Upper trap stretch 30s x 2 bilaterally Low row: yellow band 3x10 Supine cervical rotation AAROM x 10 bilaterally Cervical roll in place in pillow

## 2015-06-30 ENCOUNTER — Other Ambulatory Visit: Payer: Self-pay | Admitting: Obstetrics & Gynecology

## 2015-06-30 NOTE — Telephone Encounter (Signed)
Medication refill request: Premarin  Last AEX:  02/24/15 JJ Next AEX: none Last MMG (if hormonal medication request): 04/06/15 [place] Bilateral BIRADS3:Probably Benign  Refill authorized: 09/27/14 #30g/1R. Today please advise.

## 2015-07-01 NOTE — Telephone Encounter (Signed)
Left voicemail with message below for pt.

## 2015-07-01 NOTE — Telephone Encounter (Signed)
I'm confused, she wasn't listed as using premarin at her annual exam or f/u appointment. She is on oral HRT. Is this a request from the pharmacy or the patient? If the patient wants premarin cream, she can use 1/2 a gram 2 x a week at hs, #30, 1 refill

## 2015-07-01 NOTE — Telephone Encounter (Signed)
Yes it is absolutely fine to just use as needed.  Thanks.

## 2015-07-01 NOTE — Telephone Encounter (Signed)
Patient states she does not need refills on premarin at this moment.   But wants to know is she should keep using it. Prescribed twice a week and she did that the first 2 weeks. But now she only uses it whenever she feels sx coming back, as needed. Is it ok to use it as needed?    Please advise.  Routed to Dr. [name]

## 2015-07-13 ENCOUNTER — Ambulatory Visit: Payer: 59 | Attending: Family Medicine

## 2015-07-13 DIAGNOSIS — M542 Cervicalgia: Secondary | ICD-10-CM | POA: Insufficient documentation

## 2015-07-13 DIAGNOSIS — M25511 Pain in right shoulder: Secondary | ICD-10-CM | POA: Insufficient documentation

## 2015-07-13 NOTE — Therapy (Addendum)
[place] MAIN [facility] SERVICES [address] [place], [place], [address] Phone: [phone]   Fax:  [phone]  Physical Therapy Treatment  Patient Details  Name: Alicia Alvarez MRN: [id number] Date of Birth: 1951-01-14 Referring Provider: Jill Alexanders  Encounter Date: 07/13/2015      PT End of Session - 07/13/15 1732    Visit Number 5   Number of Visits 9   Date for PT Re-Evaluation 07/21/15   PT Start Time 7106   PT Stop Time 1720   PT Time Calculation (min) 45 min   Activity Tolerance Patient tolerated treatment well;No increased pain   Behavior During Therapy [facility] for tasks assessed/performed      Past Medical History  Diagnosis Date  . Ankle fracture     right    Past Surgical History  Procedure Laterality Date  . Cesarean section      There were no vitals filed for this visit.  Visit Diagnosis:  Neck pain      Subjective Assessment - 07/13/15 1731    Subjective pt reports she fears she is not improving. she is also haveing R shoulder pain and requests PT screen this to see if it is neck related.    Currently in Pain? Yes   Pain Score 2    Pain Location --  R shoulder and neck     Therex Shoulder exam Pt has full ROM with pain at end range flexion and IR Pt has pain with resisted shoulder flexion, empty can, resisted abduction, resisted ER. MMT4/5 for each Dermatomes and myotomes were WNL bilaterally Repeated cervical motion testing was WNL with exception of neck pain- no radicular symptoms + neer test +hawkins kennedy test + empty can  +tenderness over greater tubericle  sidelyiner ER -R: 0lbs 2x10 Upper trap stretch 30sx 2 Pt requires min verbal and tactile cues for proper exercise performance    Manual therapyL Extensive Soft tissue massage and ischemic trigger point release to bilateral neck musculature, upper trap, scalenes, levator scap, and SCM Patient received dry needling therapy education and  acknowledged understanding of risks and benefits of dry needling therapy prior to receiving treatment. Patient voiced understanding of treatment options and elected to proceed with dry needling therapy.   Trigger point dry needling to R upper trap. Twitch response elicited. Pt reported referred pain to jaw, ear and head during needling. Followed by passive upper trap stretch. 30s x 2\ Pt had much improved ROM and less pain following therapy.                                                 PT Education - 07/13/15 1732    Education provided Yes   Education Details dry needling education, result of shoulder exam    Person(s) Educated Patient   Methods Explanation   Comprehension Verbalized understanding            PT Long Term Goals - 08/02/15 0907    PT LONG TERM GOAL #1   Title Patient will report a worst pain of 3/10 on VAS in neck            to improve tolerance with ADLs and reduced symptoms with activities   Time 8   Period Weeks   Status Achieved   PT LONG TERM GOAL #2   Title  Patient will be able to perform household work/ chores without increase in symptoms   Time 8   Period Weeks   Status Partially Met   PT LONG TERM GOAL #3   Title Patient will improve AROM neck so they are able to perform work related functions for 4 hours without increased pain   Time 8   Period Weeks   Status On-going   PT LONG TERM GOAL #4   Title pt will be able to lift 3lbs object over head x 5 without increased shoulder pain    Time 4   Period Weeks   Status New                Plan - 07/13/15 1733    Clinical Impression Statement following cervial and R shoulder exam it seems pts shoulder pain is not directly linked to the neck. she does have signs and symptoms of R shoulder impingement. initiated R shoulder RTC strengthening today along with reinforcement of posture correction as well as ergonomic modification to keep thumb up above  shoulder level. pt responded well to manual therapy on the neck today which included dry needling. pt had less pain and improved L cervical rotation and sidebending following.    Pt will benefit from skilled therapeutic intervention in order to improve on the following deficits Pain;Decreased mobility;Decreased range of motion;Decreased activity tolerance   Rehab Potential Good   PT Frequency 2x / week   PT Duration 8 weeks   PT Treatment/Interventions Cryotherapy;Electrical Stimulation;Moist Heat;Traction;Ultrasound;Manual techniques;Therapeutic exercise;Therapeutic activities   PT Next Visit Plan modalities and manual therpay   Consulted and Agree with Plan of Care Patient        Problem List There are no active problems to display for this patient.  Gorden Harms. Santhiago Collingsworth, PT, DPT [phone]  Aivy Akter 07/13/2015, 5:35 PM  [place] MAIN [facility] SERVICES [address] [place], [place], [address] Phone: [phone]   Fax:  [phone]  Name: Alicia Alvarez MRN: [id number] Date of Birth: [birth year]

## 2015-07-18 ENCOUNTER — Ambulatory Visit: Payer: 59

## 2015-07-18 DIAGNOSIS — M542 Cervicalgia: Secondary | ICD-10-CM

## 2015-07-18 DIAGNOSIS — M25511 Pain in right shoulder: Secondary | ICD-10-CM

## 2015-07-18 NOTE — Patient Instructions (Signed)
[email or web address] Low row yellow 3x10 Mid row yellow band 3x10 Shoulder horiz abduction yellow 3x10 Shoulder IR yellow 2x10 sidelying shoulder ER 3x10 SCM stretch 30s x 2 Upper trap stretch 30s x 2

## 2015-07-18 NOTE — Therapy (Signed)
[place] MAIN [facility] SERVICES [address] [place], [place], [address] Phone: [phone]   Fax:  [phone]  Physical Therapy Treatment  Patient Details  Name: Alicia Alvarez MRN: [id number] Date of Birth: 14-Sep-1950 Referring Angellica Maddison: Jill Alexanders  Encounter Date: 07/18/2015      PT End of Session - 07/18/15 1751    Visit Number 6   Number of Visits 9   Date for PT Re-Evaluation 07/21/15   PT Start Time 6962   PT Stop Time 1750   PT Time Calculation (min) 60 min   Activity Tolerance Patient tolerated treatment well;No increased pain   Behavior During Therapy [facility] for tasks assessed/performed      Past Medical History  Diagnosis Date  . Ankle fracture     right    Past Surgical History  Procedure Laterality Date  . Cesarean section      There were no vitals filed for this visit.  Visit Diagnosis:  Neck pain  Pain in joint of right shoulder      Subjective Assessment - 07/18/15 1750    Subjective pt reports her neck still feels quite a bit looser since last session. she reports achiness to the R shoulder.    Currently in Pain? Yes   Pain Score 2    Pain Location --  R shoulder     MHP applied to cervical spine in supine prior to therapy x 4 min no charge  therex:  Low row yellow 3x10 Mid row yellow band 3x10 Shoulder horiz abduction yellow 3x10 Shoulder IR yellow 2x10 sidelying shoulder ER 3x10 SCM stretch 30s x 2 bilaterally Upper trap stretch 30s x 2 bilaterally  Pt requires mod verbal and tactile cues for proper exercise performance      Manual therapy: Extensive Soft tissue massage and ischemic trigger point release to bilateral neck musculature, upper trap, scalenes, levator scap, and SCM Sub occipital release 2 min x 2 Manual intermittent traction x 4 min CPA and R cervical side glides grade 2-3 for pain reduction and improved mobility C2-6 20s x 2 each level Patient received dry needling therapy  education and acknowledged understanding of risks and benefits of dry needling therapy prior to receiving treatment. Patient voiced understanding of treatment options and elected to proceed with dry needling therapy.  Trigger point dry needling to R upper trap. Twitch response elicited. Pt reported referred pain to jaw, ear and head during needling. Pt had much improved ROM and less pain following therapy.                           PT Long Term Goals - 06/28/15 1010    PT LONG TERM GOAL #1   Title Patient will report a worst pain of 3/10 on VAS in neck            to improve tolerance with ADLs and reduced symptoms with activities   Time 8   Period Weeks   Status Partially Met   PT LONG TERM GOAL #2   Title Patient will be able to perform household work/ chores without increase in symptoms   Time 8   Period Weeks   Status On-going   PT LONG TERM GOAL #3   Title Patient will improve AROM neck so they are able to perform work related functions with less pain   Time 8   Period Weeks   Status On-going  Plan - 07/18/15 1752    Clinical Impression Statement pt did well with progression of strengthening activites. large right trigger point in upper trap was smaller and with less tone today noted. PT did note more biceps tone today likely as a secondary shoulder flexor due to scapular and RTC weakness. pt needed min to mod cues for proper exercise performance today likely needing further review next session. pt particularly tender C2-4   Pt will benefit from skilled therapeutic intervention in order to improve on the following deficits Pain;Decreased mobility;Decreased range of motion;Decreased activity tolerance   Rehab Potential Good   PT Frequency 2x / week   PT Duration 8 weeks   PT Treatment/Interventions Cryotherapy;Electrical Stimulation;Moist Heat;Traction;Ultrasound;Manual techniques;Therapeutic exercise;Therapeutic activities   PT Next Visit  Plan modalities and manual therpay   Consulted and Agree with Plan of Care Patient        Problem List There are no active problems to display for this patient.  Gorden Harms. Tortorici, PT, DPT [phone]  Tortorici,Ashley 07/18/2015, 5:55 PM  [place] MAIN [facility] SERVICES [address] [place], [place], [address] Phone: [phone]   Fax:  [phone]  Name: Alicia Alvarez MRN: [id number] Date of Birth: May 14, 1951

## 2015-07-28 ENCOUNTER — Ambulatory Visit: Payer: 59

## 2015-07-28 DIAGNOSIS — M25511 Pain in right shoulder: Secondary | ICD-10-CM

## 2015-07-28 DIAGNOSIS — M542 Cervicalgia: Secondary | ICD-10-CM

## 2015-07-28 NOTE — Therapy (Signed)
[place] MAIN [facility] SERVICES [address] [place], [place], [address] Phone: [phone]   Fax:  [phone]  Physical Therapy Treatment  Patient Details  Name: Alicia Alvarez MRN: [id number] Date of Birth: [birth year] Referring Provider: Jill Alexanders  Encounter Date: 07/28/2015      PT End of Session - 07/28/15 1838    Visit Number 7   Number of Visits 9   Date for PT Re-Evaluation 07/21/15   PT Start Time 6962   PT Stop Time 9528   PT Time Calculation (min) 45 min   Activity Tolerance Patient tolerated treatment well;No increased pain   Behavior During Therapy [facility] for tasks assessed/performed      Past Medical History  Diagnosis Date  . Ankle fracture     right    Past Surgical History  Procedure Laterality Date  . Cesarean section      There were no vitals filed for this visit.  Visit Diagnosis:  Neck pain  Pain in joint of right shoulder      Subjective Assessment - 07/28/15 1837    Subjective pt reports her neck is doing quite a bit better. she has noticed little pain and less popping. she reports her R shoulder is hurting more and comes to therapy with questions about her HEP   Currently in Pain? Yes   Pain Score 4    Pain Location --  R deltoid insertion        therex: HEP modification:  Prone horizontal shoulder abduction 2x10 (vs with band in standing) Supine IR with 1lb weight 2x10 (vs with band in standing) Low row with cues to scap depression yellow band 2x10 Extensive pt education on PT hypothesis regarding the cause of her shoulder pain: IE periscapular weakness causing upper trap, deltoid, biceps over use and shoulder impingement with anatomy structure visual aids  Manual therapy: Soft tissue massage to R biceps and deltoid: effleurage and muscle stripping   Patient received dry needling therapy education and acknowledged understanding of risks and benefits of dry needling therapy prior to  receiving treatment. Patient voiced understanding of treatment options and elected to proceed with dry needling therapy.   Trigger point dry needling to R anterior and middle deltoid. Twitch response and deep ache noted referring to the wrist Followed by brief Soft tissue massage and deltoid stretching 30s x 2                        PT Education - 07/28/15 1838    Education provided Yes   Education Details dry needling education, over use of secondary shoulder flexors causing pain/triggerpoints/impingement, modification of HEP   Person(s) Educated Patient   Methods Explanation   Comprehension Verbalized understanding;Returned demonstration             PT Long Term Goals - 06/28/15 1010    PT LONG TERM GOAL #1   Title Patient will report a worst pain of 3/10 on VAS in neck            to improve tolerance with ADLs and reduced symptoms with activities   Time 8   Period Weeks   Status Partially Met   PT LONG TERM GOAL #2   Title Patient will be able to perform household work/ chores without increase in symptoms   Time 8   Period Weeks   Status On-going   PT LONG TERM GOAL #3   Title Patient will improve  AROM neck so they are able to perform work related functions with less pain   Time 8   Period Weeks   Status On-going               Plan - 07/28/15 1839    Clinical Impression Statement PT was able to modify periscapular strengthening HEP to reduce pain. pt had did very well with treatment today regarding soft tissue mobilization to the R arm as well as dry needling to the R deltoid. she reported no achiness after session.    Pt will benefit from skilled therapeutic intervention in order to improve on the following deficits Pain;Decreased mobility;Decreased range of motion;Decreased activity tolerance   Rehab Potential Good   PT Frequency 2x / week   PT Duration 8 weeks   PT Treatment/Interventions Cryotherapy;Electrical Stimulation;Moist  Heat;Traction;Ultrasound;Manual techniques;Therapeutic exercise;Therapeutic activities   PT Next Visit Plan modalities and manual therpay   Consulted and Agree with Plan of Care Patient        Problem List There are no active problems to display for this patient.  Gorden Harms. Morad Tal, PT, DPT [phone]  Gregory Dowe 07/28/2015, 6:42 PM  [place] MAIN [facility] SERVICES [address] [place], [place], [address] Phone: [phone]   Fax:  [phone]  Name: Alicia Alvarez MRN: [id number] Date of Birth: 09-04-1950

## 2015-07-29 ENCOUNTER — Ambulatory Visit (INDEPENDENT_AMBULATORY_CARE_PROVIDER_SITE_OTHER): Payer: 59 | Admitting: Certified Nurse Midwife

## 2015-07-29 ENCOUNTER — Encounter: Payer: Self-pay | Admitting: Certified Nurse Midwife

## 2015-07-29 ENCOUNTER — Telehealth: Payer: Self-pay | Admitting: Certified Nurse Midwife

## 2015-07-29 VITALS — BP 104/64 | HR 72 | Resp 16 | Ht 66.75 in | Wt 142.0 lb

## 2015-07-29 DIAGNOSIS — L298 Other pruritus: Secondary | ICD-10-CM | POA: Diagnosis not present

## 2015-07-29 DIAGNOSIS — N898 Other specified noninflammatory disorders of vagina: Secondary | ICD-10-CM

## 2015-07-29 DIAGNOSIS — B373 Candidiasis of vulva and vagina: Secondary | ICD-10-CM | POA: Diagnosis not present

## 2015-07-29 DIAGNOSIS — B3731 Acute candidiasis of vulva and vagina: Secondary | ICD-10-CM

## 2015-07-29 MED ORDER — NYSTATIN-TRIAMCINOLONE 100000-0.1 UNIT/GM-% EX CREA: 1.0000 "application " | TOPICAL_CREAM | Freq: Two times a day (BID) | CUTANEOUS | Status: DC

## 2015-07-29 NOTE — Telephone Encounter (Signed)
Patient was just in to see Debbi and her pharmacy will not have the medication until Monday. While waiting on response from Debbi, patient's pharmacy called back to let her know that she can get 1/2 of the medication filled at another pharmacy. Patient was agreeable to this and will pick up 1/2 today and wait to get the rest Monday.

## 2015-07-29 NOTE — Progress Notes (Signed)
64 yo  [id number] here with complaint of vaginal symptoms of itching, burning external only with slight edema.. Describes discharge as unknown due to using Premarin cream..Has atrophic vaginitis  Using Premarin cream twice weekly with good results. No pain with sexual activity. Onset of symptoms 2 days ago. Denies new personal products .No STD concerns. Urinary symptoms none. Menopausal,on HRT working well. Spouse no symptoms. Denies vaginal bleeding. No other health concerns today.   O:Healthy female WDWN Affect: normal, orientation x 3  Exam: Abdomen:soft, non tender Lymph node: no enlargement or tenderness Pelvic exam: External genital: normal female BUS: negative, bladder, urethral meatus non tender Vagina: Premarin cream noted in vaginal vault, healthy tissues noted, unable to see any additional discharge noted. Ph:4.0   ,Wet prep taken, Affirm taken Cervix: normal, non tender, no CMT Uterus: normal, non tender Adnexa:normal, non tender, no masses or fullness noted   Wet Prep results: positive yeast external, ? Yeast internal  A:Normal pelvic exam Atrophic vaginitis with good response to Premarin Yeast vulvitis ? Yeast vaginitis will await affirm results to treat if indicated  P:Discussed findings of yeast vulvitis and etiology. Discussed Aveeno sitz bath for comfort. Avoid moist clothes  for extended period of time. If working out in gym clothes or swim suits for long periods of time change underwear or bottoms of swimsuit if possible. Coconut Oil use for skin protection prior to activity can be used to external skin for protection or dryness. Rx: Mycolog cream with instructions Questions addressed at length.   Rv prn

## 2015-07-29 NOTE — Patient Instructions (Signed)

## 2015-07-30 LAB — WET PREP BY MOLECULAR PROBE
Candida species: POSITIVE — AB
GARDNERELLA VAGINALIS: NEGATIVE
TRICHOMONAS VAG: NEGATIVE

## 2015-07-30 NOTE — Progress Notes (Signed)
Reviewed personally.  M. Suzanne Davonda Ausley, MD.  

## 2015-08-02 ENCOUNTER — Other Ambulatory Visit: Payer: Self-pay

## 2015-08-02 MED ORDER — FLUCONAZOLE 150 MG PO TABS: ORAL_TABLET | ORAL | Status: DC

## 2015-08-02 NOTE — Addendum Note (Signed)
Addended by: Mariane Masters on: 08/02/2015 09:14 AM   Modules accepted: Orders

## 2015-08-02 NOTE — Telephone Encounter (Signed)
rx sent thru to pharmacy for diflucan based off of positive yeast on affirm culture.

## 2015-08-03 ENCOUNTER — Ambulatory Visit: Payer: 59 | Attending: Family Medicine

## 2015-08-03 DIAGNOSIS — M542 Cervicalgia: Secondary | ICD-10-CM | POA: Insufficient documentation

## 2015-08-03 DIAGNOSIS — M25511 Pain in right shoulder: Secondary | ICD-10-CM | POA: Insufficient documentation

## 2015-08-04 ENCOUNTER — Ambulatory Visit: Payer: 59

## 2015-08-04 DIAGNOSIS — F4322 Adjustment disorder with anxiety: Secondary | ICD-10-CM | POA: Diagnosis not present

## 2015-08-04 NOTE — Therapy (Signed)
[place] MAIN [facility] SERVICES [address] [place], [place], [address] Phone: [phone]   Fax:  [phone]  Physical Therapy Treatment  Patient Details  Name: CARMAN AUXIER MRN: [id number] Date of Birth: 1951-06-05 Referring Provider: Jill Alexanders  Encounter Date: 08/03/2015      PT End of Session - 08/04/15 0758    Visit Number 7   Number of Visits 9   Date for PT Re-Evaluation 08/30/15   PT Start Time 5993   PT Stop Time 1730   PT Time Calculation (min) 35 min   Activity Tolerance Patient tolerated treatment well;No increased pain   Behavior During Therapy [facility] for tasks assessed/performed      Past Medical History  Diagnosis Date  . Ankle fracture     right    Past Surgical History  Procedure Laterality Date  . Cesarean section      There were no vitals filed for this visit.  Visit Diagnosis:  Neck pain  Pain in joint of right shoulder      Subjective Assessment - 08/04/15 0756    Subjective pt reports she is happy with her progress so far. she feels that her neck is much improved and feels stronger, although still stiff on the R side. she reports her shoulder aching is much improved since last visit and thinks the manual therapy and dry needling really benefited her. she is compliant with HEP/.    Patient Stated Goals redce pain. return to swimming    Currently in Pain? Yes   Pain Score 1    Pain Location --  R neck, and R shoulder deltoid area   Pain Descriptors / Indicators Aching      MHP to neck no charge  Manual therapy: Soft tissue massage to R biceps, R upper trap and deltoid: effleurage and muscle stripping   Patient received dry needling therapy education and acknowledged understanding of risks and benefits of dry needling therapy prior to receiving treatment. Patient voiced understanding of treatment options and elected to proceed with dry needling therapy.  Trigger point dry needling to R  anterior and middle deltoid as well as R upper trap. Twitch response and deep ache noted referring to the wrist Followed by brief Soft tissue massage and deltoid stretching 30s x 2 and upper trap stretching passively 30s x 3 Pt noted immediate relief of R arm aching following therapy.                            PT Education - 08/04/15 0757    Education provided Yes   Education Details dry needling education. benefit of continued strengthening.    Person(s) Educated Patient   Methods Explanation   Comprehension Verbalized understanding             PT Long Term Goals - 08/02/15 0907    PT LONG TERM GOAL #1   Title Patient will report a worst pain of 3/10 on VAS in neck            to improve tolerance with ADLs and reduced symptoms with activities   Time 8   Period Weeks   Status Achieved   PT LONG TERM GOAL #2   Title Patient will be able to perform household work/ chores without increase in symptoms   Time 8   Period Weeks   Status Partially Met   PT LONG TERM GOAL #3   Title  Patient will improve AROM neck so they are able to perform work related functions for 4 hours without increased pain   Time 8   Period Weeks   Status On-going   PT LONG TERM GOAL #4   Title pt will be able to lift 3lbs object over head x 5 without increased shoulder pain    Time 4   Period Weeks   Status New               Plan - 08/04/15 0759    Clinical Impression Statement pt is progressing very well with HEP, posture correction and manual therpay with much less pain both at work and at home. she did still have R upper trap trigger point and R middle deltoid trigger point referring pain to her lower arm which were both treated with manual therapy and dry needling today. pt is compliant with HEP.    Pt will benefit from skilled therapeutic intervention in order to improve on the following deficits Pain;Decreased mobility;Decreased range of motion;Decreased activity  tolerance   Rehab Potential Good   PT Frequency 2x / week   PT Duration 8 weeks   PT Treatment/Interventions Cryotherapy;Electrical Stimulation;Moist Heat;Traction;Ultrasound;Manual techniques;Therapeutic exercise;Therapeutic activities   PT Next Visit Plan modalities and manual therpay   Consulted and Agree with Plan of Care Patient        Problem List There are no active problems to display for this patient.  Gorden Harms. Reginald Weida, PT, DPT [phone]  Natalio Salois 08/04/2015, 8:01 AM  [place] MAIN [facility] SERVICES [address] [place], [place], [address] Phone: [phone]   Fax:  [phone]  Name: BRYAUNA BYRUM MRN: [id number] Date of Birth: 12/01/1950

## 2015-08-10 ENCOUNTER — Ambulatory Visit: Payer: 59

## 2015-08-10 DIAGNOSIS — M25511 Pain in right shoulder: Secondary | ICD-10-CM | POA: Diagnosis not present

## 2015-08-10 DIAGNOSIS — M542 Cervicalgia: Secondary | ICD-10-CM

## 2015-08-11 NOTE — Patient Instructions (Signed)
[email or web address] Prone shoulder horiz abduction 2  X 10 Prone shoulder flexion ("Y") 2 x 10 cues for scapular retraction and depression Serratus punch red band in supine 3x10 D1 shoulder extension red band 3x10 D2 shoulder flexion yellow band 2x10

## 2015-08-11 NOTE — Therapy (Signed)
[place] MAIN [facility] SERVICES [address] [place], [place], [address] Phone: [phone]   Fax:  [phone]  Physical Therapy Treatment  Patient Details  Name: Alicia Alvarez MRN: [id number] Date of Birth: 01-21-1951 Referring Provider: Jill Alexanders  Encounter Date: 08/10/2015      PT End of Session - 08/11/15 0845    Visit Number 8   Number of Visits 9   Date for PT Re-Evaluation 08/30/15   PT Start Time 8144   PT Stop Time 1830   PT Time Calculation (min) 45 min   Activity Tolerance Patient tolerated treatment well;No increased pain   Behavior During Therapy [facility] for tasks assessed/performed      Past Medical History  Diagnosis Date  . Ankle fracture     right    Past Surgical History  Procedure Laterality Date  . Cesarean section      There were no vitals filed for this visit.  Visit Diagnosis:  Neck pain  Pain in joint of right shoulder      Subjective Assessment - 08/10/15 1759    Subjective (p) pt reports she is happy with her progress so far. she feels that her neck is much improved and feels stronger, although still stiff on the R side. she reports her shoulder aching is much improved since last visit and thinks the manual therapy and dry needling really benefited her. she is compliant with HEP/.    Patient Stated Goals (p) redce pain. return to swimming          MHP to cervical spine x 8 min no charge  Therex: Prone shoulder horiz abduction 2  X 10 Prone shoulder flexion ("Y") 2 x 10 cues for scapular retraction and depression Serratus punch red band in supine 3x10 D1 shoulder extension red band 3x10 D2 shoulder flexion yellow band 2x10 Pt requires min verbal and tactile cues for proper exercise performance   Pt did not have increased pain with exercises.                         PT Education - 08/11/15 0844    Education provided Yes   Education Details progressed HEP   Person(s)  Educated Patient   Methods Explanation   Comprehension Verbalized understanding             PT Long Term Goals - 08/02/15 0907    PT LONG TERM GOAL #1   Title Patient will report a worst pain of 3/10 on VAS in neck            to improve tolerance with ADLs and reduced symptoms with activities   Time 8   Period Weeks   Status Achieved   PT LONG TERM GOAL #2   Title Patient will be able to perform household work/ chores without increase in symptoms   Time 8   Period Weeks   Status Partially Met   PT LONG TERM GOAL #3   Title Patient will improve AROM neck so they are able to perform work related functions for 4 hours without increased pain   Time 8   Period Weeks   Status On-going   PT LONG TERM GOAL #4   Title pt will be able to lift 3lbs object over head x 5 without increased shoulder pain    Time 4   Period Weeks   Status New  Plan - 08/11/15 0845    Clinical Impression Statement pt continues to progress well towards goals and is having much less pain. progressed HEP for continued strengthening. plan on DC after next visit, pt instructed to try new HEP and return with any questions or concerns. trigger point in R deltoid was much less prominent and less painful upon palpation today.    Pt will benefit from skilled therapeutic intervention in order to improve on the following deficits Pain;Decreased mobility;Decreased range of motion;Decreased activity tolerance   Rehab Potential Good   PT Frequency 2x / week   PT Duration 8 weeks   PT Treatment/Interventions Cryotherapy;Electrical Stimulation;Moist Heat;Traction;Ultrasound;Manual techniques;Therapeutic exercise;Therapeutic activities   PT Next Visit Plan modalities and manual therpay   Consulted and Agree with Plan of Care Patient        Problem List There are no active problems to display for this patient.  Gorden Harms. Vencil Basnett, PT, DPT [phone]  [name] 08/11/2015, 8:47 AM  [place] MAIN [facility] SERVICES [address] [place], [place], [address] Phone: [phone]   Fax:  [phone]  Name: Alicia Alvarez MRN: [id number] Date of Birth: 02-10-1951

## 2015-08-23 ENCOUNTER — Ambulatory Visit: Payer: 59 | Admitting: Obstetrics & Gynecology

## 2015-08-29 ENCOUNTER — Encounter: Payer: Self-pay | Admitting: Family Medicine

## 2015-08-29 ENCOUNTER — Ambulatory Visit (INDEPENDENT_AMBULATORY_CARE_PROVIDER_SITE_OTHER): Payer: 59 | Admitting: Family Medicine

## 2015-08-29 VITALS — BP 112/80 | HR 84 | Wt 142.0 lb

## 2015-08-29 DIAGNOSIS — M7581 Other shoulder lesions, right shoulder: Secondary | ICD-10-CM

## 2015-08-29 DIAGNOSIS — J069 Acute upper respiratory infection, unspecified: Secondary | ICD-10-CM

## 2015-08-29 NOTE — Progress Notes (Signed)
   Subjective:    Patient ID: Alicia Alvarez, female    DOB: [birth year], 65 y.o.   MRN: [id number]  HPI She complains of a one-month history of right shoulder pain. No history of injury or overuseto the shoulder. She does state that it sometimes wakes her up at night. Has been no popping locking or grinding. She does feel pain in the deltoid and biceps muscle area.As been using ibuprofen up to 1200 mg per day without much success.She also  has a five-day history of nasal congestion, sore throat, left ear congestion and now a productive cough but no fever, chills, shortness of breath.  Review of Systems     Objective:   Physical Exam Alert and in no distress. Tympanic membranes and canals are normal. Pharyngeal area is normal. Neck is supple without adenopathy or thyromegaly. Cardiac exam shows a regular sinus rhythm without murmurs or gallops. Lungs are clear to auscultation. All motion of the shoulder. No laxity noted. Neer's and Hawkin's test negative.Rocketed testing of the rotator cuff muscles did cause discomfort.       Assessment & Plan:  Rotator cuff tendinitis, right  URI, acute Supportive care for the URI including short-term use of Afrin for the ear congestion and NyQuil at night. Also discussed the right shoulder pain in terms of proper therapy.'s cussed continuing the NSAID and rehabilitation. She has been doing some rehabilitation. We elected to give an injection. The shoulder was prepped in the posterior lateral area and 40 mg of Kenalog with 3 mL of Xylocaine was injected into the subacromial bursa without difficulty. If no improvement, we will need to proceed further with x-rays and look more at her shoulder joint. This was explained to her.

## 2015-09-01 ENCOUNTER — Telehealth: Payer: Self-pay | Admitting: Family Medicine

## 2015-09-01 MED ORDER — AZITHROMYCIN 500 MG PO TABS: 500.0000 mg | ORAL_TABLET | Freq: Every day | ORAL | Status: DC

## 2015-09-01 NOTE — Telephone Encounter (Signed)
Pt called she states terrible productive cough and ears are still blocked.  She has green phlegm.  Taking Sudafed during day and Benedryl at night for 7 days.  Please call her in something to [facility].

## 2015-09-05 DIAGNOSIS — H524 Presbyopia: Secondary | ICD-10-CM | POA: Diagnosis not present

## 2015-09-05 DIAGNOSIS — H5213 Myopia, bilateral: Secondary | ICD-10-CM | POA: Diagnosis not present

## 2015-09-08 ENCOUNTER — Telehealth: Payer: Self-pay | Admitting: Family Medicine

## 2015-09-08 ENCOUNTER — Other Ambulatory Visit: Payer: Self-pay | Admitting: *Deleted

## 2015-09-08 MED ORDER — AZITHROMYCIN 500 MG PO TABS: 500.0000 mg | ORAL_TABLET | Freq: Every day | ORAL | Status: DC

## 2015-09-08 NOTE — Telephone Encounter (Signed)
Pt called and stated that she is still sick. She states she is some better but still has congestion, full ears and green mucus coming out of her nose. Pt also wants to know if it is ok for her to be taking 1200 mg of Mucinex. Pt uses [facility] [place]. She can be reached at [phone].

## 2015-09-08 NOTE — Telephone Encounter (Signed)
Patient is asking for refill on z-pak states she has a productive cough & green mucus coming from sinuses

## 2015-09-08 NOTE — Telephone Encounter (Signed)
Let her know that there is really no good data on Mucinex being that effective. She can do just as well drinking plenty of fluids.

## 2015-09-14 ENCOUNTER — Telehealth: Payer: Self-pay | Admitting: Family Medicine

## 2015-09-14 NOTE — Telephone Encounter (Signed)
Let her know that I think it would be fine to get the TDaP. A reaction to the flu shot doesn't mean she would have the same reaction with a different immunization

## 2015-09-14 NOTE — Telephone Encounter (Signed)
Pt called with a question. She states that her work is requesting that she receive a TDAP immunization. She did have a reaction to the flu shot. She would like to know if it is ok to receive due to her history of past reaction. Please call pt at [phone].

## 2015-09-15 ENCOUNTER — Other Ambulatory Visit: Payer: Self-pay | Admitting: Obstetrics & Gynecology

## 2015-09-15 ENCOUNTER — Ambulatory Visit (INDEPENDENT_AMBULATORY_CARE_PROVIDER_SITE_OTHER): Payer: 59

## 2015-09-15 ENCOUNTER — Ambulatory Visit (INDEPENDENT_AMBULATORY_CARE_PROVIDER_SITE_OTHER): Payer: 59 | Admitting: Obstetrics & Gynecology

## 2015-09-15 ENCOUNTER — Telehealth: Payer: Self-pay | Admitting: Obstetrics & Gynecology

## 2015-09-15 VITALS — BP 120/88 | Ht 66.75 in | Wt 140.0 lb

## 2015-09-15 DIAGNOSIS — N950 Postmenopausal bleeding: Secondary | ICD-10-CM | POA: Diagnosis not present

## 2015-09-15 DIAGNOSIS — N841 Polyp of cervix uteri: Secondary | ICD-10-CM

## 2015-09-15 DIAGNOSIS — D251 Intramural leiomyoma of uterus: Secondary | ICD-10-CM

## 2015-09-15 DIAGNOSIS — N840 Polyp of corpus uteri: Secondary | ICD-10-CM | POA: Diagnosis not present

## 2015-09-15 MED ORDER — HYDROCODONE-ACETAMINOPHEN 5-325 MG PO TABS: 1.0000 | ORAL_TABLET | Freq: Four times a day (QID) | ORAL | Status: DC | PRN

## 2015-09-15 NOTE — Telephone Encounter (Signed)
Patient called and said she'd like an appointment with Dr. [name] as soon as possible for recent "bleeding after menopause."  Paper chart to triage.

## 2015-09-15 NOTE — Telephone Encounter (Signed)
Spoke with patient. Patient states that yesterday she began to have light pink drainage when she wiped. This morning when she used the restroom she states she passed a clot with old blood. Denies any further bleeding since this morning. Denies any pelvic discomfort. She is on HRT but states she has not missed any pills. Is currently taking Prometrium 100 mg daily and using Minivelle .0375 mg. Advised she will need to be seen in the office for further evaluation. She is agreeable. Advised I will speak with Dr.Miller and return call regarding scheduling. She is agreeable and verbalizes understanding.

## 2015-09-15 NOTE — Progress Notes (Signed)
GYNECOLOGY  VISIT   HPI: 65 y.o. [id number] Divorced Caucasian female here for evaluation of PMP bleeding that started 09/14/15 as light pink vaginal discharge but changed into dark and old appearing blood with clot.  She has been cramping a little bit but denies pain.  She is on HRT with Minivelle 0.0375mg  and prometrium 100mg  daily.  Denies change in urinary habits or bowel habits.  Denies back pain.  Denies other hormonal symptoms like breast tenderness or acne.  GYNECOLOGIC HISTORY: Patient's last menstrual period was 07/30/2002. Contraception: PMP status Menopausal hormone therapy: on Minivelle and Prometrium Last Pap smear: 10/21/14 with neg HR HPV Last MMG: 03/31/15 with ultrasound.  Cysts.  Has 6 month follow up scheduled.  There are no active problems to display for this patient.   Past Medical History  Diagnosis Date  . Ankle fracture     right    Past Surgical History  Procedure Laterality Date  . Cesarean section      MEDS:  Reviewed in EPIC and UTD  ALLERGIES: Macrodantin and Sulfa antibiotics  Family History  Problem Relation Age of Onset  . Cancer Father     PROSTATE    SH:  Divorced.  Non smoker.  Review of Systems  All other systems reviewed and are negative.   PHYSICAL EXAMINATION:    BP 120/88 mmHg  Ht 5' 6.75" (1.695 m)  Wt 140 lb (63.504 kg)  BMI 22.10 kg/m2  LMP 07/30/2002    General appearance: alert, cooperative and appears stated age CV:  Regular rate and rhythm or without murmur or extra heart sounds Lungs:  clear to auscultation, no wheezes, rales or rhonchi, symmetric air entry Abdomen: soft, non-tender; bowel sounds normal; no masses,  no organomegaly  Pelvic: External genitalia:  no lesions              Urethra:  normal appearing urethra with no masses, tenderness or lesions              Bartholins and Skenes: normal                 Vagina: normal appearing vagina with normal color and discharge, no lesions              Cervix: no  lesions              Bimanual Exam:  Uterus:  normal size, contour, position, consistency, mobility, non-tender              Adnexa: no mass, fullness, tenderness              Rectovaginal: No..  Confirms.              Anus:  normal sphincter tone, no lesions  Chaperone was present for exam.  Technique:  Both transabdominal and transvaginal ultrasound examinations of the pelvis were performed. Transabdominal technique was performed for global imaging of the pelvis including uterus, ovaries, adnexal regions, and pelvic cul-de-sac.  It was necessary to proceed with endovaginal exam following the abdominal ultrasound transabdominal exam to visualize the endometrium and adnexa.  Color and duplex Doppler ultrasound was utilized to evaluate blood flow to the ovaries.   Ultrasound reviewed:   Uterus:  8.1 x 5.0 x 4.1cm with 1.1 x 1.0cm fibroid Endometrium 5.23mm  Left ovary 1.3 x 0.6 x 0.4cm Right ovary 1.3 x 0.6 x 0.45cm  SHSG:  After obtaining appropriate verbal consent from patient, the cervix was visualized using a speculum, and prepped with  betadine.  A tenaculum  was applied to the cervix.  Dilation of the cervix was not necessary. The catheter was passed into the uterus and sterile saline introduced, with the following findings: at least two endometrial filling defects consistent with polyps noted and one in the endocervical canal noted.  This could represent a clot.  Findings reviewed with pt.  Recommended hysteroscopy with polyp resection and D&C.  Procedure discussed with patient.  Recovery and pain management discussed.  Risks discussed including but not limited to bleeding, rare risk of transfusion, infection, 1% risk of uterine perforation with risks of fluid deficit causing cardiac arrythmia, cerebral swelling and/or need to stop procedure early.  Fluid emboli and rare risk of death discussed.  DVT/PE, rare risk of risk of bowel/bladder/ureteral/vascular injury.  Patient aware if pathology  abnormal she may need additional treatment.  All questions answered.  Pt will think about this.  Information provided.    Assessment: Post menopausal bleeding Endometrial polyps 1.1cm uterine fibroids Probable endocervical polyp  Plan: Hysteroscopy with polyp resection, D&C recommended.  Pt will call back with decision.  Rx given for Vicodin 5/325 1-2 tab every 4-6 hours No other change in medications needed.  May need to adjust HRT after procedure but will decide with final pathology.    ~30 minutes spent with patient >50% of time was in face to face discussion of above.

## 2015-09-15 NOTE — Telephone Encounter (Signed)
Spoke with patient. Advised I have spoken with Dr.Miller who would like her to been seen today for PUS. She is agreeable. Appointment scheduled for today at 1 pm for PUS with 1:30 pm consult with Dr.Miller. Order for PUS and possible EMB placed for precert.  Cc: Lerry Liner  Routing to provider for final review. Patient agreeable to disposition. Will close encounter.

## 2015-09-15 NOTE — Telephone Encounter (Signed)
Unable to leave message, mailbox is full.

## 2015-09-19 ENCOUNTER — Telehealth: Payer: Self-pay | Admitting: *Deleted

## 2015-09-19 ENCOUNTER — Telehealth: Payer: Self-pay | Admitting: Obstetrics and Gynecology

## 2015-09-19 NOTE — Telephone Encounter (Signed)
Patient is still having continued bleeding and wants to talk with nurse asap.

## 2015-09-19 NOTE — Telephone Encounter (Signed)
Patient decided to provided surgery pre payment over the phone. Will advised Gay Filler for surgery scheduling

## 2015-09-19 NOTE — Telephone Encounter (Signed)
Patient is still having some bleeding and is anxious to schedule surgery. Patient would like to get this scheduled ASAP.

## 2015-09-19 NOTE — Telephone Encounter (Signed)
Call to patient. Surgery date options discussed. Patient decided to proceed with Monday, March 13, at [facility]. Time to be determined. Surgery instruction sheet reviewed and printed copy will be mailed, see copy scanned to chart.  \ Routing to provider for final review. Patient agreeable to disposition. Will close encounter.

## 2015-09-19 NOTE — Telephone Encounter (Signed)
Spoke with pt regarding benefit for surgery. Patient understood and agreeable. Patient to stop by office to provide pre-payment. Patient aware this is professional benefit only. Patient aware will be contacted by hospital for separate benefits.

## 2015-09-19 NOTE — Telephone Encounter (Signed)
Call to patient. She states she has been having intermittent bright red vaginal bleeding since Sonohysterogram and intermittent cramps that she describes as menstrual cramps. Denies heavy vaginal bleeding, but has episodes of bleeding when she bears down to have a bowel movement. She denies being dizzy or lightheaded.   She states she discussed a Friday surgical time with Dr. [name].   Advised will send a message to surgical nurse and insurance to ensure that process underway for her.  Advised to call back with any heavy bleeding or concerning symptoms. Patient agreeable.

## 2015-09-22 ENCOUNTER — Other Ambulatory Visit: Payer: Self-pay | Admitting: Obstetrics & Gynecology

## 2015-09-23 DIAGNOSIS — L111 Transient acantholytic dermatosis [Grover]: Secondary | ICD-10-CM | POA: Diagnosis not present

## 2015-09-23 DIAGNOSIS — Z85828 Personal history of other malignant neoplasm of skin: Secondary | ICD-10-CM | POA: Diagnosis not present

## 2015-09-23 DIAGNOSIS — B078 Other viral warts: Secondary | ICD-10-CM | POA: Diagnosis not present

## 2015-09-23 DIAGNOSIS — L858 Other specified epidermal thickening: Secondary | ICD-10-CM | POA: Diagnosis not present

## 2015-09-23 MED FILL — IMIQUIMOD 5% CREAM: 5 | 24 days supply | Qty: 24 | Fill #0

## 2015-09-25 ENCOUNTER — Other Ambulatory Visit: Payer: Self-pay | Admitting: Obstetrics & Gynecology

## 2015-09-25 ENCOUNTER — Encounter: Payer: Self-pay | Admitting: Obstetrics & Gynecology

## 2015-09-25 DIAGNOSIS — N841 Polyp of cervix uteri: Secondary | ICD-10-CM | POA: Insufficient documentation

## 2015-09-25 DIAGNOSIS — N840 Polyp of corpus uteri: Secondary | ICD-10-CM | POA: Insufficient documentation

## 2015-09-25 DIAGNOSIS — D251 Intramural leiomyoma of uterus: Secondary | ICD-10-CM | POA: Insufficient documentation

## 2015-09-26 ENCOUNTER — Telehealth: Payer: Self-pay | Admitting: Obstetrics & Gynecology

## 2015-09-26 NOTE — Telephone Encounter (Signed)
Patient called requesting to speak to [facility] about two different letters she received with conflicting dates for surgery.

## 2015-09-26 NOTE — Telephone Encounter (Signed)
Patient needs information as soon as possible. Patient gives permission to leave a detailed message on her voicemail.

## 2015-09-29 MED FILL — TRIAMCINOLONE 0.1% CREAM: 0.1 | 21 days supply | Qty: 60 | Fill #0

## 2015-10-03 ENCOUNTER — Other Ambulatory Visit: Payer: Self-pay

## 2015-10-03 ENCOUNTER — Encounter (HOSPITAL_COMMUNITY): Payer: Self-pay

## 2015-10-03 ENCOUNTER — Encounter (HOSPITAL_COMMUNITY)
Admission: RE | Admit: 2015-10-03 | Discharge: 2015-10-03 | Disposition: A | Discharge status: Home / Self Care | Payer: 59 | Source: Ambulatory Visit | Attending: Obstetrics & Gynecology | Admitting: Obstetrics & Gynecology

## 2015-10-03 ENCOUNTER — Telehealth: Payer: Self-pay | Admitting: *Deleted

## 2015-10-03 DIAGNOSIS — Z01812 Encounter for preprocedural laboratory examination: Secondary | ICD-10-CM | POA: Diagnosis not present

## 2015-10-03 HISTORY — DX: Unspecified hemorrhoids: K64.9

## 2015-10-03 HISTORY — DX: Transient acantholytic dermatosis (grover): L11.1

## 2015-10-03 LAB — CBC
HEMATOCRIT: 39.1 % (ref 36.0–46.0)
Hemoglobin: 13.5 g/dL (ref 12.0–15.0)
MCH: 31.3 pg (ref 26.0–34.0)
MCHC: 34.5 g/dL (ref 30.0–36.0)
MCV: 90.5 fL (ref 78.0–100.0)
Platelets: 221 10*3/uL (ref 150–400)
RBC: 4.32 MIL/uL (ref 3.87–5.11)
RDW: 12.9 % (ref 11.5–15.5)
WBC: 5.6 10*3/uL (ref 4.0–10.5)

## 2015-10-03 NOTE — Telephone Encounter (Signed)
Patient calling to confirm start time of surgery for Monday. After review with physicians, advised surgery time is still 1100.   Routing to provider for final review.  Will close encounter.

## 2015-10-03 NOTE — Patient Instructions (Addendum)
Your procedure is scheduled on:  Monday, March 13  Enter through the Main Entrance of [facility] at: 9:30am  Pick up the phone at the desk and dial [phone].  Call this number if you have problems the morning of surgery: [phone].  Remember: Do NOT eat food: after midnight Sunday, March 12 Do NOT drink clear liquids after midnight Sunday, March 12 Take these medicines the morning of surgery with a SIP OF WATER:  None  Do NOT wear jewelry (body piercing), metal hair clips/bobby pins, make-up, or nail polish. Do NOT wear lotions, powders, or perfumes.  You may wear deoderant. Do NOT shave for 48 hours prior to surgery. Do NOT bring valuables to the hospital. Contacts, dentures, or bridgework may not be worn into surgery.  Have a responsible adult drive you home and stay with you for 24 hours after your procedure.  Home with partner Argie Ramming cell [phone]

## 2015-10-05 DIAGNOSIS — F4322 Adjustment disorder with anxiety: Secondary | ICD-10-CM | POA: Diagnosis not present

## 2015-10-10 ENCOUNTER — Ambulatory Visit (HOSPITAL_COMMUNITY): Payer: 59 | Admitting: Anesthesiology

## 2015-10-10 ENCOUNTER — Ambulatory Visit (HOSPITAL_COMMUNITY)
Admission: RE | Admit: 2015-10-10 | Discharge: 2015-10-10 | Disposition: A | Discharge status: Home / Self Care | Payer: 59 | Source: Ambulatory Visit | Attending: Obstetrics & Gynecology | Admitting: Obstetrics & Gynecology

## 2015-10-10 ENCOUNTER — Encounter (HOSPITAL_COMMUNITY)
Admission: RE | Disposition: A | Discharge status: Home / Self Care | Payer: Self-pay | Source: Ambulatory Visit | Attending: Obstetrics & Gynecology

## 2015-10-10 ENCOUNTER — Encounter (HOSPITAL_COMMUNITY): Payer: Self-pay

## 2015-10-10 DIAGNOSIS — N950 Postmenopausal bleeding: Secondary | ICD-10-CM | POA: Diagnosis not present

## 2015-10-10 DIAGNOSIS — N841 Polyp of cervix uteri: Secondary | ICD-10-CM | POA: Diagnosis not present

## 2015-10-10 DIAGNOSIS — Z87891 Personal history of nicotine dependence: Secondary | ICD-10-CM | POA: Diagnosis not present

## 2015-10-10 DIAGNOSIS — N840 Polyp of corpus uteri: Secondary | ICD-10-CM | POA: Diagnosis not present

## 2015-10-10 HISTORY — PX: DILATATION & CURETTAGE/HYSTEROSCOPY WITH MYOSURE: SHX6511

## 2015-10-10 SURGERY — DILATATION & CURETTAGE/HYSTEROSCOPY WITH MYOSURE
Anesthesia: General | Site: Vagina

## 2015-10-10 MED ORDER — ONDANSETRON HCL 4 MG/2ML IJ SOLN
INTRAMUSCULAR | Status: DC | PRN
  Administered 2015-10-10: 4 mg via INTRAVENOUS

## 2015-10-10 MED ORDER — MIDAZOLAM HCL 2 MG/2ML IJ SOLN
INTRAMUSCULAR | Status: DC | PRN
  Administered 2015-10-10: 2 mg via INTRAVENOUS

## 2015-10-10 MED ORDER — SILVER NITRATE-POT NITRATE 75-25 % EX MISC
CUTANEOUS | Status: AC
  Filled 2015-10-10: qty 1

## 2015-10-10 MED ORDER — SODIUM CHLORIDE 0.9 % IR SOLN
Status: DC | PRN
  Administered 2015-10-10: 3000 mL

## 2015-10-10 MED ORDER — DEXAMETHASONE SODIUM PHOSPHATE 4 MG/ML IJ SOLN
INTRAMUSCULAR | Status: AC
  Filled 2015-10-10: qty 1

## 2015-10-10 MED ORDER — LIDOCAINE-EPINEPHRINE 1 %-1:100000 IJ SOLN
INTRAMUSCULAR | Status: AC
  Filled 2015-10-10: qty 1

## 2015-10-10 MED ORDER — LIDOCAINE HCL (CARDIAC) 20 MG/ML IV SOLN
INTRAVENOUS | Status: DC | PRN
  Administered 2015-10-10: 30 mg via INTRAVENOUS

## 2015-10-10 MED ORDER — PROPOFOL 10 MG/ML IV BOLUS: INTRAVENOUS | Status: DC | PRN

## 2015-10-10 MED ORDER — ACETAMINOPHEN 325 MG PO TABS: 650.0000 mg | ORAL_TABLET | ORAL | Status: DC | PRN

## 2015-10-10 MED ORDER — FENTANYL CITRATE (PF) 100 MCG/2ML IJ SOLN
INTRAMUSCULAR | Status: AC
  Administered 2015-10-10: 50 ug via INTRAVENOUS
  Filled 2015-10-10: qty 2

## 2015-10-10 MED ORDER — PHENYLEPHRINE 40 MCG/ML (10ML) SYRINGE FOR IV PUSH (FOR BLOOD PRESSURE SUPPORT)
PREFILLED_SYRINGE | INTRAVENOUS | Status: AC
  Filled 2015-10-10: qty 10

## 2015-10-10 MED ORDER — PHENYLEPHRINE HCL 10 MG/ML IJ SOLN
INTRAMUSCULAR | Status: DC | PRN
  Administered 2015-10-10: 40 ug via INTRAVENOUS

## 2015-10-10 MED ORDER — MIDAZOLAM HCL 2 MG/2ML IJ SOLN: 0.5000 mg | Freq: Once | INTRAMUSCULAR | Status: DC | PRN

## 2015-10-10 MED ORDER — FENTANYL CITRATE (PF) 100 MCG/2ML IJ SOLN
25.0000 ug | INTRAMUSCULAR | Status: DC | PRN
  Administered 2015-10-10: 50 ug via INTRAVENOUS

## 2015-10-10 MED ORDER — PROPOFOL 10 MG/ML IV BOLUS
INTRAVENOUS | Status: AC
  Filled 2015-10-10: qty 20

## 2015-10-10 MED ORDER — KETOROLAC TROMETHAMINE 30 MG/ML IJ SOLN
INTRAMUSCULAR | Status: AC
  Filled 2015-10-10: qty 1

## 2015-10-10 MED ORDER — ACETAMINOPHEN 650 MG RE SUPP
650.0000 mg | RECTAL | Status: DC | PRN
  Filled 2015-10-10: qty 1

## 2015-10-10 MED ORDER — FENTANYL CITRATE (PF) 100 MCG/2ML IJ SOLN
INTRAMUSCULAR | Status: DC | PRN
  Administered 2015-10-10: 25 ug via INTRAVENOUS
  Administered 2015-10-10: 50 ug via INTRAVENOUS

## 2015-10-10 MED ORDER — OXYCODONE HCL 5 MG PO TABS
5.0000 mg | ORAL_TABLET | ORAL | Status: DC | PRN
  Administered 2015-10-10: 5 mg via ORAL

## 2015-10-10 MED ORDER — OXYCODONE HCL 5 MG PO TABS
ORAL_TABLET | ORAL | Status: AC
  Filled 2015-10-10: qty 1

## 2015-10-10 MED ORDER — LIDOCAINE HCL (CARDIAC) 20 MG/ML IV SOLN
INTRAVENOUS | Status: AC
  Filled 2015-10-10: qty 5

## 2015-10-10 MED ORDER — LIDOCAINE-EPINEPHRINE 1 %-1:100000 IJ SOLN
INTRAMUSCULAR | Status: DC | PRN
  Administered 2015-10-10: 10 mL

## 2015-10-10 MED ORDER — DEXAMETHASONE SODIUM PHOSPHATE 10 MG/ML IJ SOLN
INTRAMUSCULAR | Status: DC | PRN
  Administered 2015-10-10: 10 mg via INTRAVENOUS

## 2015-10-10 MED ORDER — FENTANYL CITRATE (PF) 100 MCG/2ML IJ SOLN
INTRAMUSCULAR | Status: AC
Start: 2015-10-10 — End: 2015-10-10
  Filled 2015-10-10: qty 2

## 2015-10-10 MED ORDER — PROPOFOL 10 MG/ML IV BOLUS
INTRAVENOUS | Status: DC | PRN
  Administered 2015-10-10: 160 mg via INTRAVENOUS
  Administered 2015-10-10: 40 mg via INTRAVENOUS

## 2015-10-10 MED ORDER — MEPERIDINE HCL 25 MG/ML IJ SOLN: 6.2500 mg | INTRAMUSCULAR | Status: DC | PRN

## 2015-10-10 MED ORDER — ONDANSETRON HCL 4 MG/2ML IJ SOLN
INTRAMUSCULAR | Status: AC
  Filled 2015-10-10: qty 2

## 2015-10-10 MED ORDER — PROMETHAZINE HCL 25 MG/ML IJ SOLN: 6.2500 mg | INTRAMUSCULAR | Status: DC | PRN

## 2015-10-10 MED ORDER — LACTATED RINGERS IV SOLN
INTRAVENOUS | Status: DC
  Administered 2015-10-10: 125 mL/h via INTRAVENOUS
  Administered 2015-10-10: 11:00:00 via INTRAVENOUS

## 2015-10-10 MED ORDER — KETOROLAC TROMETHAMINE 30 MG/ML IJ SOLN
INTRAMUSCULAR | Status: DC | PRN
  Administered 2015-10-10: 30 mg via INTRAVENOUS

## 2015-10-10 MED ORDER — MIDAZOLAM HCL 2 MG/2ML IJ SOLN
INTRAMUSCULAR | Status: AC
  Filled 2015-10-10: qty 2

## 2015-10-10 SURGICAL SUPPLY — 21 items
CANISTER SUCT 3000ML (MISCELLANEOUS) ×2 IMPLANT
CATH ROBINSON RED A/P 16FR (CATHETERS) ×2 IMPLANT
CLOTH BEACON ORANGE TIMEOUT ST (SAFETY) ×2 IMPLANT
CONTAINER PREFILL 10% NBF 60ML (FORM) ×3 IMPLANT
DEVICE MYOSURE LITE (MISCELLANEOUS) ×1 IMPLANT
DEVICE MYOSURE REACH (MISCELLANEOUS) IMPLANT
DILATOR CANAL MILEX (MISCELLANEOUS) IMPLANT
ELECT REM PT RETURN 9FT ADLT (ELECTROSURGICAL)
ELECTRODE REM PT RTRN 9FT ADLT (ELECTROSURGICAL) IMPLANT
FILTER ARTHROSCOPY CONVERTOR (FILTER) ×2 IMPLANT
GLOVE BIOGEL PI IND STRL 7.0 (GLOVE) ×2 IMPLANT
GLOVE BIOGEL PI INDICATOR 7.0 (GLOVE) ×2
GLOVE ECLIPSE 6.5 STRL STRAW (GLOVE) ×4 IMPLANT
GOWN STRL REUS W/TWL LRG LVL3 (GOWN DISPOSABLE) ×4 IMPLANT
PACK VAGINAL MINOR WOMEN LF (CUSTOM PROCEDURE TRAY) ×2 IMPLANT
PAD OB MATERNITY 4.3X12.25 (PERSONAL CARE ITEMS) ×2 IMPLANT
SEAL ROD LENS SCOPE MYOSURE (ABLATOR) ×2 IMPLANT
TOWEL OR 17X24 6PK STRL BLUE (TOWEL DISPOSABLE) ×4 IMPLANT
TUBING AQUILEX INFLOW (TUBING) ×2 IMPLANT
TUBING AQUILEX OUTFLOW (TUBING) ×2 IMPLANT
WATER STERILE IRR 1000ML POUR (IV SOLUTION) ×1 IMPLANT

## 2015-10-10 NOTE — Transfer of Care (Signed)
Immediate Anesthesia Transfer of Care Note  Patient: Alicia Alvarez  Procedure(s) Performed: Procedure(s): DILATATION & CURETTAGE/HYSTEROSCOPY WITH MYOSURE (N/A)  Patient Location: PACU  Anesthesia Type:General  Level of Consciousness: awake, alert , oriented and patient cooperative  Airway & Oxygen Therapy: Patient Spontanous Breathing and Patient connected to nasal cannula oxygen  Post-op Assessment: Report given to RN and Post -op Vital signs reviewed and stable  Post vital signs: Reviewed and stable  Last Vitals:  Filed Vitals:   10/10/15 0941  BP: 115/89  Pulse: 80  Temp: 36.4 C  Resp: 16    Complications: No apparent anesthesia complications

## 2015-10-10 NOTE — Anesthesia Postprocedure Evaluation (Signed)
Anesthesia Post Note  Patient: Alicia Alvarez  Procedure(s) Performed: Procedure(s) (LRB): DILATATION & CURETTAGE/HYSTEROSCOPY WITH MYOSURE (N/A)  Patient location during evaluation: PACU Anesthesia Type: General Level of consciousness: awake and alert, oriented and patient cooperative Pain management: pain level controlled Vital Signs Assessment: post-procedure vital signs reviewed and stable Respiratory status: spontaneous breathing, nonlabored ventilation and respiratory function stable Cardiovascular status: blood pressure returned to baseline and stable Postop Assessment: no signs of nausea or vomiting Anesthetic complications: no    Last Vitals:  Filed Vitals:   10/10/15 1200 10/10/15 1210  BP:    Pulse:    Temp:  36.6 C  Resp: 16 16    Last Pain:  Filed Vitals:   10/10/15 1212  PainSc: 2                  Shean Gerding,E. Latresa Gasser

## 2015-10-10 NOTE — Anesthesia Procedure Notes (Signed)
Procedure Name: LMA Insertion Date/Time: 10/10/2015 10:44 AM Performed by: Raenette Rover Pre-anesthesia Checklist: Patient identified, Emergency Drugs available, Suction available and Patient being monitored Patient Re-evaluated:Patient Re-evaluated prior to inductionOxygen Delivery Method: Circle system utilized Preoxygenation: Pre-oxygenation with 100% oxygen Intubation Type: IV induction Ventilation: Mask ventilation without difficulty LMA: LMA inserted LMA Size: 4.0 Number of attempts: 1 Placement Confirmation: positive ETCO2,  CO2 detector and breath sounds checked- equal and bilateral Tube secured with: Tape Dental Injury: Teeth and Oropharynx as per pre-operative assessment

## 2015-10-10 NOTE — Discharge Instructions (Addendum)
Post-surgical Instructions, Outpatient Surgery  You may expect to feel dizzy, weak, and drowsy for as long as 24 hours after receiving the medicine that made you sleep (anesthetic). For the first 24 hours after your surgery:    Do not drive a car, ride a bicycle, participate in physical activities, or take public transportation until you are done taking narcotic pain medicines or as directed by Dr. [name].   Do not drink alcohol or take tranquilizers.   Do not take medicine that has not been prescribed by your physicians.   Do not sign important papers or make important decisions while on narcotic pain medicines.   Have a responsible person with you.   Do remove the scopolamine patch (behind your ear) if one was used.  This can stay on for up to 72 hours.  PAIN MANAGEMENT  Motrin 800mg .  (This is the same as 4-200mg  over the counter tablets of Motrin or ibuprofen.)  You may take this every eight hours or as needed for cramping.    Vicodin 5/325mg .  For more severe pain, take one or two tablets every four to six hours as needed for pain control.  (Remember that narcotic pain medications increase your risk of constipation.  If this becomes a problem, you may take an over the counter stool softener like Colace 100mg  up to four times a day.)  DO'S AND DON'T'S  Do not take a tub bath for one week.  You may shower on the first day after your surgery  Do not do any heavy lifting for one to two weeks.  This increases the chance of bleeding.  Do move around as you feel able.  Stairs are fine.  You may begin to exercise again as you feel able.  Do not lift any weights for two weeks.  Do not put anything in the vagina for two weeks--no tampons, intercourse, or douching.    REGULAR MEDIATIONS/VITAMINS:  You may restart all of your regular medications as prescribed.  You may restart all of your vitamins as you normally take them.    PLEASE CALL OR SEEK MEDICAL CARE IF:  You have persistent  nausea and vomiting.   You have trouble eating or drinking.   You have an oral temperature above 100.5.   You have constipation that is not helped by adjusting diet or increasing fluid intake. Pain medicines are a common cause of constipation.   You have heavy vaginal bleeding  You have redness or drainage from your incision(s) or there is increasing pain or tenderness near or in the surgical site.    NO IBUPROFEN PRODUCTS (MOTRIN, ADVIL) OR ALEVE UNTIL 5:00PM TODAY.

## 2015-10-10 NOTE — H&P (Signed)
Alicia Alvarez is an 65 y.o. female G3P1 here for hysteroscopy and polyp resection that was noted with evaluation for PMP bleeding.  Evaluation performed on 09/15/15 showing endocervical polyp, endometrial polyps vs polypoid appearing endometrium and thickened endometrial lining.  Hysteroscopy vs office biopsy and conservative management were discussed.  Risks and benefits were discussed.  Pt has opted to proceed with surgical evaluation.  She is here and ready to proceed.  All questions answered.    Pertinent Gynecological History: Menses: post-menopausal Bleeding: post menopausal bleeding Contraception: post menopausal status DES exposure: denies Blood transfusions: none Sexually transmitted diseases: no past history Previous GYN Procedures: cesarean section  Last mammogram: normal Date: 9/16 Last pap: normal Date: 3/14 OB History: G3, P1   Menstrual History: Patient's last menstrual period was 07/30/2002.    Past Medical History  Diagnosis Date  . Ankle fracture     right - no surgery required  . Hemorrhoids     external  . 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 Cesarean section  1989    x 1  . Adnoidectomy      as child  . Dilation and curettage of uterus      MAB in 1993 or 1994    Family History  Problem Relation Age of Onset  . Cancer Father     PROSTATE    Social History:  reports that she has quit smoking. Her smoking use included Cigarettes. She has a 1.5 pack-year smoking history. She has never used smokeless tobacco. She reports that she drinks about 3.0 oz of alcohol per week. She reports that she does not use illicit drugs.  Allergies:  Allergies  Allergen Reactions  . Macrodantin [Nitrofurantoin] Rash  . Sulfa Antibiotics Hives and Rash    Prescriptions prior to admission  Medication Sig Dispense Refill Last Dose  . estradiol (MINIVELLE) 0.0375 MG/24HR Place 1 patch onto the skin 2 (two)  times a week. (Patient taking differently: Place 0.5 patches onto the skin once a week. ) 24 patch 3 Past Week at Unknown time  . HYDROcodone-acetaminophen (NORCO/VICODIN) 5-325 MG tablet Take 1-2 tablets by mouth every 6 (six) hours as needed for moderate pain or severe pain. 12 tablet 0   . ibuprofen (ADVIL,MOTRIN) 200 MG tablet Take 200 mg by mouth every 6 (six) hours as needed for moderate pain.    Past Month at Unknown time  . PREMARIN vaginal cream Place 1 Applicatorful vaginally 2 (two) times a week.   1 Past Month at Unknown time  . progesterone (PROMETRIUM) 100 MG capsule Take 1 capsule (100 mg total) by mouth daily. 90 capsule 3 10/08/2015 at 2200  . azithromycin (ZITHROMAX) 250 MG tablet   0   . azithromycin (ZITHROMAX) 500 MG tablet Take 1 tablet (500 mg total) by mouth daily. (Patient not taking: Reported on 09/20/2015) 3 tablet 0     Review of Systems  All other systems reviewed and are negative.   Blood pressure 115/89, pulse 80, temperature 97.6 F (36.4 C), temperature source Oral, resp. rate 16, last menstrual period 07/30/2002, SpO2 100 %. Physical Exam  Constitutional: She is oriented to person, place, and time. She appears well-developed and well-nourished.  Cardiovascular: Normal rate and regular rhythm.   Respiratory: Effort normal and breath sounds normal.  Neurological: She is alert and oriented to person, place, and time.  Skin: Skin is warm and dry.  Psychiatric:  She has a normal mood and affect.    No results found for this or any previous visit (from the past 24 hour(s)).  No results found.  Assessment/Plan: 65 yo G3P1 MWF with PMP bleeding, endometrial and endocervical polyps and thickened endometrium here for hysteroscopy, polyp resection, D&C.  Pt ready to proceed.  Hale Bogus SUZANNE 10/10/2015, 10:10 AM

## 2015-10-10 NOTE — Anesthesia Preprocedure Evaluation (Addendum)
Anesthesia Evaluation  Patient identified by MRN, date of birth, ID band Patient awake    Reviewed: Allergy & Precautions, NPO status , Patient's Chart, lab work & pertinent test results  History of Anesthesia Complications Negative for: history of anesthetic complications  Airway Mallampati: II  TM Distance: >3 FB Neck ROM: Full    Dental  (+) Dental Advisory Given, Teeth Intact   Pulmonary former smoker,    breath sounds clear to auscultation       Cardiovascular negative cardio ROS   Rhythm:Regular Rate:Normal     Neuro/Psych negative neurological ROS     GI/Hepatic negative GI ROS, Neg liver ROS,   Endo/Other  negative endocrine ROS  Renal/GU negative Renal ROS     Musculoskeletal negative musculoskeletal ROS (+)   Abdominal   Peds  Hematology negative hematology ROS (+)   Anesthesia Other Findings   Reproductive/Obstetrics                            Anesthesia Physical Anesthesia Plan  ASA: I  Anesthesia Plan: General   Post-op Pain Management:    Induction: Intravenous  Airway Management Planned: LMA  Additional Equipment:   Intra-op Plan:   Post-operative Plan:   Informed Consent: I have reviewed the patients History and Physical, chart, labs and discussed the procedure including the risks, benefits and alternatives for the proposed anesthesia with the patient or authorized representative who has indicated his/her understanding and acceptance.   Dental advisory given  Plan Discussed with: CRNA and Surgeon  Anesthesia Plan Comments: (Plan routine monitors, GA- LMA OK)        Anesthesia Quick Evaluation

## 2015-10-10 NOTE — Op Note (Signed)
10/10/2015  11:27 AM  PATIENT:  Alicia Alvarez  65 y.o. female  PRE-OPERATIVE DIAGNOSIS:  PMB, endometrial polyp and endocervical polyp  POST-OPERATIVE DIAGNOSIS:  PMB, endometrial and endocervical polyp  PROCEDURE:  Procedure(s): DILATATION & CURETTAGE/HYSTEROSCOPY WITH MYOSURE  SURGEON:  Sharon Stapel SUZANNE  ASSISTANTS: OR staff   ANESTHESIA:   general, LMA  ESTIMATED BLOOD LOSS: 5cc  BLOOD ADMINISTERED:none   FLUIDS: 800cLR  UOP: 150cc clear UOP  SPECIMEN:  Endometrial curetting and polyps  DISPOSITION OF SPECIMEN:  PATHOLOGY  FINDINGS: endometrial polyp on right side, irregular and fluffy appearing area on anterior aspect of endometrial cavity, endocervical polyp  DESCRIPTION OF OPERATION: Patient was taken to the operating room.  She is placed in the supine position. SCDs were on her lower extremities and functioning properly. General anesthesia with an LMA was administered without difficulty. Dr. Glennon Mac oversaw case.  Legs were then placed in the [place] in the low lithotomy position. The legs were lifted to the high lithotomy position and the Betadine prep was used on the inner thighs perineum and vagina x3. Patient was draped in a normal standard fashion. An in and out catheterization with a red rubber Foley catheter was performed. Approximately 150 cc of clear urine was noted. A bivalve speculum was placed the vagina. The anterior lip of the cervix was grasped with single-tooth tenaculum.  A paracervical block of 1% lidocaine mixed one-to-one with epinephrine (1:100,000 units).  10 cc was used total. The cervix is dilated up to #21 [facility] dilators. The endometrial cavity sounded to 7 cm.   A 2.9 millimeter diagnostic hysteroscope was obtained. Saline was used as the hysteroscopic fluid. The hysteroscope was advanced through the endocervical canal into the endometrial cavity. The tubal ostia were noted bilaterally. Above findings were noted.  Using the Myosure  lite, the polyp and irregular appearing area was excised.  This was then brought through the cervical canal to remove the endocervical polyp.  The hysteroscope was removed. A #1 toothed curette was used to curette the cavity until rough gritty texture was noted in all quadrants. With revisualization of the hysteroscope the polyps and irregular areas were removed.  At this point the procedure was ended. The hysteroscope was removed. The fluid deficit was 60 cc. The tenaculum was removed from the anterior lip of the cervix. The speculum was removed from the vagina. The prep was cleansed of the patient's skin. The legs are positioned back in the supine position. Sponge, lap, needle, initially counts were correct x2. Patient was taken to recovery in stable condition.  COUNTS:  YES  PLAN OF CARE: Transfer to PACU

## 2015-10-11 ENCOUNTER — Encounter (HOSPITAL_COMMUNITY): Payer: Self-pay | Admitting: Obstetrics & Gynecology

## 2015-10-14 MED FILL — IMIQUIMOD 5% CREAM PACKET: 5 | 24 days supply | Qty: 24 | Fill #1

## 2015-10-20 DIAGNOSIS — F4322 Adjustment disorder with anxiety: Secondary | ICD-10-CM | POA: Diagnosis not present

## 2015-10-27 ENCOUNTER — Ambulatory Visit (INDEPENDENT_AMBULATORY_CARE_PROVIDER_SITE_OTHER): Payer: 59 | Admitting: Obstetrics & Gynecology

## 2015-10-27 ENCOUNTER — Encounter: Payer: Self-pay | Admitting: Obstetrics & Gynecology

## 2015-10-27 VITALS — BP 100/66 | HR 64 | Resp 16 | Ht 66.75 in | Wt 137.0 lb

## 2015-10-27 DIAGNOSIS — N840 Polyp of corpus uteri: Secondary | ICD-10-CM

## 2015-10-27 DIAGNOSIS — N950 Postmenopausal bleeding: Secondary | ICD-10-CM

## 2015-10-27 NOTE — Progress Notes (Signed)
Post Operative Visit  Procedure: DILATATION & CURETTAGE/HYSTEROSCOPY WITH MYOSURE  Days Post-op:  17  Subjective: Pt reports she is doing very well.  Denies any pain except for immediate post op.  She took Motrin once.  Pathology reviewed.  Objective: Ht 5' 6.75" (1.695 m)  LMP 07/30/2002  EXAM General: alert and cooperative GI: soft, non-tender; bowel sounds normal; no masses,  no organomegaly Extremities: extremities normal, atraumatic, no cyanosis or edema Vaginal Bleeding: none  Gyn:  NAEFG, vagina without lesions, cervix close, no CMT  Assessment: s/p hysteroscopy with polyp resection, D&C  Plan: Released for normal activity Pt will call with any bleeding Follow-up for AEX

## 2015-11-03 DIAGNOSIS — F4322 Adjustment disorder with anxiety: Secondary | ICD-10-CM | POA: Diagnosis not present

## 2015-11-10 DIAGNOSIS — L570 Actinic keratosis: Secondary | ICD-10-CM | POA: Diagnosis not present

## 2015-11-10 DIAGNOSIS — L821 Other seborrheic keratosis: Secondary | ICD-10-CM | POA: Diagnosis not present

## 2015-11-10 DIAGNOSIS — Z85828 Personal history of other malignant neoplasm of skin: Secondary | ICD-10-CM | POA: Diagnosis not present

## 2015-11-10 DIAGNOSIS — D485 Neoplasm of uncertain behavior of skin: Secondary | ICD-10-CM | POA: Diagnosis not present

## 2015-11-10 DIAGNOSIS — L111 Transient acantholytic dermatosis [Grover]: Secondary | ICD-10-CM | POA: Diagnosis not present

## 2015-11-10 DIAGNOSIS — B078 Other viral warts: Secondary | ICD-10-CM | POA: Diagnosis not present

## 2015-11-10 DIAGNOSIS — L858 Other specified epidermal thickening: Secondary | ICD-10-CM | POA: Diagnosis not present

## 2015-11-10 MED FILL — IMIQUIMOD 5% CREAM PACKET: 5 | 12 days supply | Qty: 24 | Fill #0

## 2015-11-17 ENCOUNTER — Ambulatory Visit: Payer: 59 | Admitting: Family Medicine

## 2015-11-18 ENCOUNTER — Encounter: Payer: Self-pay | Admitting: Family Medicine

## 2015-11-18 ENCOUNTER — Ambulatory Visit (INDEPENDENT_AMBULATORY_CARE_PROVIDER_SITE_OTHER): Payer: 59 | Admitting: Family Medicine

## 2015-11-18 VITALS — BP 100/68 | HR 80

## 2015-11-18 DIAGNOSIS — M25511 Pain in right shoulder: Secondary | ICD-10-CM | POA: Diagnosis not present

## 2015-11-18 DIAGNOSIS — M7581 Other shoulder lesions, right shoulder: Secondary | ICD-10-CM | POA: Diagnosis not present

## 2015-11-18 MED ORDER — TRIAMCINOLONE ACETONIDE 40 MG/ML IJ SUSP
40.0000 mg | Freq: Once | INTRAMUSCULAR | Status: AC
  Administered 2015-11-18: 40 mg via INTRAMUSCULAR

## 2015-11-18 MED ORDER — LIDOCAINE HCL (PF) 1 % IJ SOLN
2.0000 mL | Freq: Once | INTRAMUSCULAR | Status: AC
  Administered 2015-11-18: 2 mL via INTRADERMAL

## 2015-11-18 NOTE — Progress Notes (Signed)
   Subjective:    Patient ID: Alicia Alvarez, female    DOB: March 05, 1951, 65 y.o.   MRN: [id number]  HPI He is again having difficulty with right shoulder pain. She does have good motion of the shoulder but does experience some nighttime pain. She would like another injection as it did help with her previous pain.   Review of Systems     Objective:   Physical Exam No palpable tenderness. Full motion of the shoulder. Neer's and Hawkins test was uncomfortable. Supraspinatus testing was also uncomfortable.       Assessment & Plan:  Pain in joint of right shoulder - Plan: triamcinolone acetonide (KENALOG-40) injection 40 mg, lidocaine (PF) (XYLOCAINE) 1 % injection 2 mL  Rotator cuff tendinitis, right the shoulder was prepped in the posterolateral area. 40 mg of Kenalog and 3 mL of Xylocaine was injected in the subacromial bursa with relatively quick relief of pain. Discussed the fact that if continued difficulty, further evaluation including MRI will probably be needed.

## 2015-11-24 ENCOUNTER — Encounter: Payer: Self-pay | Admitting: Family Medicine

## 2015-12-02 ENCOUNTER — Ambulatory Visit: Payer: 59 | Admitting: Family Medicine

## 2015-12-07 DIAGNOSIS — F4322 Adjustment disorder with anxiety: Secondary | ICD-10-CM | POA: Diagnosis not present

## 2015-12-08 ENCOUNTER — Telehealth: Payer: Self-pay | Admitting: *Deleted

## 2015-12-08 NOTE — Telephone Encounter (Signed)
Patient is past due for recommended breast imaging due 08/2015  Last MMG and Results: 03/31/15 with Bilateral Ultrasound 04/06/15 Multiple complex/complicated cysts in both breasts   Recommendation: Repeat ultrasound in 6 months  Patient has not scheduled or completed recommended breast imaging due 08/2015 at [name].    I spoke to the patient to schedule.  Patient is agreeable to scheduling follow up ultrasound.  Patient declined offer for our office to schedule appointment for her.  She will call to schedule.  Patient wanted to know when annual mammogram was due and when next annual exam with Dr. [name] was scheduled.  This information was given to the patient.  Recall extended to May 2017 for follow up.  Routing to provider for final review.  Closing encounter.

## 2015-12-20 ENCOUNTER — Telehealth: Payer: Self-pay | Admitting: Family Medicine

## 2015-12-20 NOTE — Telephone Encounter (Signed)
Alicia Alvarez called and wants you to give her a call on a personal matter pt can be reached at [phone] best time is before 1pm or after 6 pm

## 2015-12-22 DIAGNOSIS — F4322 Adjustment disorder with anxiety: Secondary | ICD-10-CM | POA: Diagnosis not present

## 2015-12-29 DIAGNOSIS — F4322 Adjustment disorder with anxiety: Secondary | ICD-10-CM | POA: Diagnosis not present

## 2016-01-05 DIAGNOSIS — F4322 Adjustment disorder with anxiety: Secondary | ICD-10-CM | POA: Diagnosis not present

## 2016-01-11 ENCOUNTER — Ambulatory Visit (INDEPENDENT_AMBULATORY_CARE_PROVIDER_SITE_OTHER): Payer: 59 | Admitting: Family Medicine

## 2016-01-11 ENCOUNTER — Encounter: Payer: Self-pay | Admitting: Family Medicine

## 2016-01-11 ENCOUNTER — Other Ambulatory Visit: Payer: Self-pay

## 2016-01-11 VITALS — BP 104/70 | HR 80 | Wt 132.0 lb

## 2016-01-11 DIAGNOSIS — K3189 Other diseases of stomach and duodenum: Secondary | ICD-10-CM

## 2016-01-11 DIAGNOSIS — K319 Disease of stomach and duodenum, unspecified: Secondary | ICD-10-CM | POA: Diagnosis not present

## 2016-01-11 DIAGNOSIS — F4322 Adjustment disorder with anxiety: Secondary | ICD-10-CM | POA: Diagnosis not present

## 2016-01-11 MED ORDER — HYOSCYAMINE SULFATE 0.125 MG SL SUBL: 0.1250 mg | SUBLINGUAL_TABLET | SUBLINGUAL | Status: DC | PRN
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
## 2016-01-16 ENCOUNTER — Telehealth: Payer: Self-pay | Admitting: Obstetrics & Gynecology

## 2016-01-16 MED ORDER — FLUCONAZOLE 150 MG PO TABS: 150.0000 mg | ORAL_TABLET | Freq: Once | ORAL | Status: DC

## 2016-01-16 NOTE — Telephone Encounter (Signed)
Since she had recent surgery and several apt with that -  Ok to refill Diflucan.  She also has an AEX in 2 months.  If symptoms not better to call for OV.

## 2016-01-16 NOTE — Telephone Encounter (Signed)
Patient wants to have something called into the pharmacy. She has a yeast infection. She is aware we do not treat over the phone.

## 2016-01-16 NOTE — Telephone Encounter (Signed)
Patient is a Marine scientist. Requesting a prescription for Diflucan.. Reports she knows this is a yeast infection. Declined appointment at this time.  Routing to Kem Boroughs, FNP for review and advise as Dr.Miller is out of the office this morning.

## 2016-01-16 NOTE — Telephone Encounter (Signed)
Spoke with patient. Advised of message as seen below from Kem Boroughs, [place]. She is agreeable and verbalizes understanding. Rx for Diflucan 150 mg #2 0RF sent to pharmacy on file. Aware if symptoms persist after taking Diflucan she will need to be seen in the office.  Cc:Dr.Miller  Routing to provider for final review. Patient agreeable to disposition. Will close encounter.

## 2016-01-20 DIAGNOSIS — F4322 Adjustment disorder with anxiety: Secondary | ICD-10-CM | POA: Diagnosis not present

## 2016-01-24 ENCOUNTER — Telehealth: Payer: Self-pay | Admitting: *Deleted

## 2016-01-24 NOTE — Telephone Encounter (Signed)
Please contact patient. She is past due for her 04 recall breast U/S with  [facility].  Thanks, Margaretha Sheffield

## 2016-01-24 NOTE — Telephone Encounter (Signed)
Dr. [name], Please see message below-  Please advise on 04 recall status Thanks Margaretha Sheffield

## 2016-01-24 NOTE — Telephone Encounter (Signed)
Called patient to follow up on 6 month breast ultrasound that was to be done in March 2017. Patient states she needs to have a diagnostic mammogram in August or September, and she said she "hasn't pursued" having the ultrasound done because she thought having the diagnostic mammogram was enough. RN offered to call and schedule for patient, but patient declined stating she would call and schedule her appointment.

## 2016-01-25 NOTE — Telephone Encounter (Signed)
Recall changed to July 2017 -eh

## 2016-01-25 NOTE — Telephone Encounter (Signed)
Change recall for one month from now.  Thanks.

## 2016-02-02 DIAGNOSIS — B078 Other viral warts: Secondary | ICD-10-CM | POA: Diagnosis not present

## 2016-02-02 DIAGNOSIS — L111 Transient acantholytic dermatosis [Grover]: Secondary | ICD-10-CM | POA: Diagnosis not present

## 2016-02-02 DIAGNOSIS — L821 Other seborrheic keratosis: Secondary | ICD-10-CM | POA: Diagnosis not present

## 2016-02-02 DIAGNOSIS — B0089 Other herpesviral infection: Secondary | ICD-10-CM | POA: Diagnosis not present

## 2016-02-02 DIAGNOSIS — Z85828 Personal history of other malignant neoplasm of skin: Secondary | ICD-10-CM | POA: Diagnosis not present

## 2016-02-02 DIAGNOSIS — L570 Actinic keratosis: Secondary | ICD-10-CM | POA: Diagnosis not present

## 2016-02-15 DIAGNOSIS — F4322 Adjustment disorder with anxiety: Secondary | ICD-10-CM | POA: Diagnosis not present

## 2016-02-17 ENCOUNTER — Telehealth: Payer: Self-pay | Admitting: Obstetrics & Gynecology

## 2016-02-17 MED ORDER — FLUCONAZOLE 150 MG PO TABS: 150.0000 mg | ORAL_TABLET | Freq: Once | ORAL | Status: DC

## 2016-02-17 MED FILL — FLUCONAZOLE 150 MG TABLET: 150 | 3 days supply | Qty: 2 | Fill #0

## 2016-02-17 NOTE — Telephone Encounter (Signed)
Spoke with patient. 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 States 3-4 days ago she began to have white discharge with itching. Symptoms have increased. Denies any odor to the discharge. She is scheduled to see Dr.Miller on 03/22/2016 for her aex. Asking if Dr.Miller can send in rx for Diflucan "to get me until I see her in August." She was last given Diflucan on 01/16/2016 and reports this cleared her symptoms. Advised I will speak with Dr.Miller and return call with further recommendations.

## 2016-02-17 NOTE — Telephone Encounter (Signed)
Patient called back and requests prescription to be sent to [facility] if possible.

## 2016-02-17 NOTE — Telephone Encounter (Signed)
Patient called to check status on prescription. Notified patient per [name] that prescription sent to pharmacy. No further questions from patient. Ok to close.

## 2016-02-17 NOTE — Telephone Encounter (Signed)
Patient called and states she is having recurrent yeast infections. Patient is requesting a medication refill.    Routing to clinical triage

## 2016-02-17 NOTE — Telephone Encounter (Signed)
Rx sent to pharmacy on file.  Please notify pt.

## 2016-02-17 NOTE — Telephone Encounter (Signed)
Left message to call Elnoria Livingston at [phone]. 

## 2016-02-20 ENCOUNTER — Ambulatory Visit: Payer: Self-pay | Admitting: Physician Assistant

## 2016-02-28 DIAGNOSIS — F4322 Adjustment disorder with anxiety: Secondary | ICD-10-CM | POA: Diagnosis not present

## 2016-02-29 ENCOUNTER — Ambulatory Visit: Payer: 59 | Admitting: Obstetrics and Gynecology

## 2016-03-06 ENCOUNTER — Ambulatory Visit (INDEPENDENT_AMBULATORY_CARE_PROVIDER_SITE_OTHER): Payer: 59 | Admitting: Family Medicine

## 2016-03-06 ENCOUNTER — Encounter: Payer: Self-pay | Admitting: Family Medicine

## 2016-03-06 VITALS — BP 100/68 | HR 86

## 2016-03-06 DIAGNOSIS — M25511 Pain in right shoulder: Secondary | ICD-10-CM
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
## 2016-03-14 ENCOUNTER — Ambulatory Visit
Admission: RE | Admit: 2016-03-14 | Discharge: 2016-03-14 | Disposition: A | Discharge status: Home / Self Care | Payer: 59 | Source: Ambulatory Visit | Attending: Family Medicine | Admitting: Family Medicine

## 2016-03-14 DIAGNOSIS — M25511 Pain in right shoulder: Secondary | ICD-10-CM

## 2016-03-14 DIAGNOSIS — M19011 Primary osteoarthritis, right shoulder: Secondary | ICD-10-CM | POA: Diagnosis not present

## 2016-03-15 ENCOUNTER — Ambulatory Visit (INDEPENDENT_AMBULATORY_CARE_PROVIDER_SITE_OTHER): Payer: 59 | Admitting: Family Medicine

## 2016-03-15 DIAGNOSIS — M25511 Pain in right shoulder: Secondary | ICD-10-CM | POA: Diagnosis not present

## 2016-03-15 DIAGNOSIS — M7551 Bursitis of right shoulder: Secondary | ICD-10-CM

## 2016-03-15 DIAGNOSIS — M7581 Other shoulder lesions, right shoulder: Secondary | ICD-10-CM

## 2016-03-15 MED ORDER — TRIAMCINOLONE ACETONIDE 40 MG/ML IJ SUSP
40.0000 mg | Freq: Once | INTRAMUSCULAR | Status: AC
  Administered 2016-03-15: 40 mg via INTRAMUSCULAR

## 2016-03-15 MED ORDER — LIDOCAINE HCL 2 % IJ SOLN
3.0000 mL | Freq: Once | INTRAMUSCULAR | Status: AC
  Administered 2016-03-15: 60 mg via INTRADERMAL

## 2016-03-15 NOTE — Progress Notes (Signed)
   Subjective:    Patient ID: Alicia Alvarez, female    DOB: 12-19-1950, 65 y.o.   MRN: [id number]  HPI He is here for shoulder injection. She did have recent MRI which did show some tendinitis as well as bursitis. She has had 2 injections in the past and did get some relief. She has also been through physical therapy for this again with relief but not complete relief.   Review of Systems     Objective:   Physical Exam  In no distress. Left shoulder not examined.      Assessment & Plan:  Right shoulder pain - Plan: lidocaine (XYLOCAINE) 2 % (with pres) injection 60 mg, triamcinolone acetonide (KENALOG-40) injection 40 mg  Rotator cuff tendinitis, right  Shoulder bursitis, right  40 mg of Kenalog and 3 mL of Xylocaine was injected into the subacromial bursa after the skin was prepped with Betadine. She tolerated the procedure well. If she continues have difficulty, I will refer her to orthopedics for further evaluation.

## 2016-03-22 ENCOUNTER — Encounter: Payer: Self-pay | Admitting: Obstetrics & Gynecology

## 2016-03-22 ENCOUNTER — Ambulatory Visit (INDEPENDENT_AMBULATORY_CARE_PROVIDER_SITE_OTHER): Payer: 59 | Admitting: Obstetrics & Gynecology

## 2016-03-22 VITALS — BP 104/82 | HR 72 | Resp 14 | Ht 66.75 in | Wt 135.0 lb

## 2016-03-22 DIAGNOSIS — F4322 Adjustment disorder with anxiety: Secondary | ICD-10-CM | POA: Diagnosis not present

## 2016-03-22 DIAGNOSIS — Z1211 Encounter for screening for malignant neoplasm of colon: Secondary | ICD-10-CM | POA: Diagnosis not present

## 2016-03-22 DIAGNOSIS — N898 Other specified noninflammatory disorders of vagina: Secondary | ICD-10-CM | POA: Diagnosis not present

## 2016-03-22 DIAGNOSIS — Z0000 Encounter for general adult medical examination without abnormal findings: Secondary | ICD-10-CM

## 2016-03-22 DIAGNOSIS — N951 Menopausal and female climacteric states: Secondary | ICD-10-CM | POA: Diagnosis not present

## 2016-03-22 DIAGNOSIS — Z205 Contact with and (suspected) exposure to viral hepatitis: Secondary | ICD-10-CM

## 2016-03-22 DIAGNOSIS — Z202 Contact with and (suspected) exposure to infections with a predominantly sexual mode of transmission: Secondary | ICD-10-CM

## 2016-03-22 DIAGNOSIS — Z124 Encounter for screening for malignant neoplasm of cervix: Secondary | ICD-10-CM | POA: Diagnosis not present

## 2016-03-22 DIAGNOSIS — Z1151 Encounter for screening for human papillomavirus (HPV): Secondary | ICD-10-CM | POA: Diagnosis not present

## 2016-03-22 DIAGNOSIS — Z01419 Encounter for gynecological examination (general) (routine) without abnormal findings: Secondary | ICD-10-CM

## 2016-03-22 LAB — POCT URINALYSIS DIPSTICK
Bilirubin, UA: NEGATIVE
Blood, UA: NEGATIVE
Glucose, UA: NEGATIVE
Ketones, UA: NEGATIVE
Leukocytes, UA: NEGATIVE
Nitrite, UA: NEGATIVE
Protein, UA: NEGATIVE
Urobilinogen, UA: NEGATIVE
pH, UA: 5

## 2016-03-22 MED ORDER — ESTRADIOL 0.0375 MG/24HR TD PTTW: 1.0000 | MEDICATED_PATCH | TRANSDERMAL | 3 refills | Status: DC

## 2016-03-22 MED ORDER — FLUCONAZOLE 150 MG PO TABS: 150.0000 mg | ORAL_TABLET | Freq: Once | ORAL | 0 refills | Status: AC

## 2016-03-22 MED ORDER — PREMARIN 0.625 MG/GM VA CREA: TOPICAL_CREAM | VAGINAL | 1 refills | Status: DC

## 2016-03-22 MED ORDER — PREMARIN 0.625 MG/GM VA CREA: 1.0000 | TOPICAL_CREAM | VAGINAL | 1 refills | Status: DC

## 2016-03-22 MED ORDER — PROGESTERONE MICRONIZED 100 MG PO CAPS: 100.0000 mg | ORAL_CAPSULE | Freq: Every day | ORAL | 3 refills | Status: DC

## 2016-03-22 NOTE — Progress Notes (Signed)
65 y.o. G62P0021 Divorced Caucasian F here for annual exam.  Doing well except for shoulder issues.  Has now had three shoulder injections.  This has helped.    Denies vaginal bleeding.  Having some vaginal burning.  Feels she has BV again or maybe yeast.  4+ yr relationship ended.  Would like STD testing.  Patient's last menstrual period was 07/30/2002.          Sexually active: Yes.    The current method of family planning is post menopausal status.    Exercising: Yes.    yoga, swimming, running Smoker:  no  Health Maintenance: Pap:  10/22/2012 negative, HR HPV negative  History of abnormal Pap:  no MMG:  03/31/2015 MMG BIRADS 0, U/S bilateral BIRADS 3 probably benign recommended 6 month follow up Colonoscopy:  Patient states "it's been a long time"   BMD:   Never  TDaP:  2010  Pneumonia vaccine(s):  never Zostavax:   never Hep C testing:  today Screening Labs: drawn today, Hb today: drawn today, Urine today: normal    reports that she has quit smoking. Her smoking use included Cigarettes. She has a 1.50 pack-year smoking history. She has never used smokeless tobacco. She reports that she drinks about 3.0 oz of alcohol per week . She reports that she does not use drugs.  Past Medical History:  Diagnosis Date  . Ankle fracture    right - no surgery required  . 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 Hemorrhoids    external    Past Surgical History:  Procedure Laterality Date  . adnoidectomy     as child  . [place]   x 1  . DILATATION & CURETTAGE/HYSTEROSCOPY WITH MYOSURE N/A 10/10/2015   Procedure: DILATATION & CURETTAGE/HYSTEROSCOPY WITH MYOSURE;  Surgeon: Megan Salon, MD;  Location: [facility];  Service: Gynecology;  Laterality: N/A;  . DILATION AND CURETTAGE OF UTERUS     MAB in 1993 or 1994    Current Outpatient Prescriptions  Medication Sig Dispense Refill  . estradiol (MINIVELLE) 0.0375 MG/24HR Place 1 patch onto the skin 2 (two) times a week.  (Patient taking differently: Place 0.5 patches onto the skin once a week. ) 24 patch 3  . fluconazole (DIFLUCAN) 150 MG tablet Take 1 tablet (150 mg total) by mouth once. Repeat in 72 hours if symptoms persist. (Patient not taking: Reported on 03/06/2016) 2 tablet 0  . hyoscyamine (LEVSIN/SL) 0.125 MG SL tablet Place 1 tablet (0.125 mg total) under the tongue every 4 (four) hours as needed. (Patient not taking: Reported on 03/06/2016) 121 tablet 0  . imiquimod (ALDARA) 5 % cream Reported on 01/11/2016  2  . PREMARIN vaginal cream Place 1 Applicatorful vaginally 2 (two) times a week.   1  . progesterone (PROMETRIUM) 100 MG capsule Take 1 capsule (100 mg total) by mouth daily. 90 capsule 3  . triamcinolone cream (KENALOG) 0.1 % Reported on 11/18/2015  0   No current facility-administered medications for this visit.     Family History  Problem Relation Age of Onset  . Cancer Father     PROSTATE    ROS:  Pertinent items are noted in HPI.  Otherwise, a comprehensive ROS was negative.  Exam:   LMP 07/30/2002   Weight change: @WEIGHTCHANGE @ Height:      Ht Readings from Last 3 Encounters:  10/27/15 5' 6.75" (1.695 m)  10/03/15 5' 6.75" (1.695 m)  09/15/15 5'  6.75" (1.695 m)    General appearance: alert, cooperative and appears stated age Head: Normocephalic, without obvious abnormality, atraumatic Neck: no adenopathy, supple, symmetrical, trachea midline and thyroid normal to inspection and palpation Lungs: clear to auscultation bilaterally Breasts: normal appearance, no masses or tenderness Heart: regular rate and rhythm Abdomen: soft, non-tender; bowel sounds normal; no masses,  no organomegaly Extremities: extremities normal, atraumatic, no cyanosis or edema Skin: Skin color, texture, turgor normal. No rashes or lesions Lymph nodes: Cervical, supraclavicular, and axillary nodes normal. No abnormal inguinal nodes palpated Neurologic: Grossly normal   Pelvic: External genitalia:  no  lesions              Urethra:  normal appearing urethra with no masses, tenderness or lesions              Bartholins and Skenes: normal                 Vagina: whitish, creamy appearing vaginal discharge noted.  No odor.  Normal vaginal mucosa.                Cervix: no lesions              Pap taken: Yes.   Bimanual Exam:  Uterus:  normal size, contour, position, consistency, mobility, non-tender              Adnexa: normal adnexa and no mass, fullness, tenderness               Rectovaginal: Confirms               Anus:  normal sphincter tone, no lesions  Chaperone was present for exam.  A:  Well Woman with normal exam Vaginal discharge H/O endometrial polyp s/p resection  Requests STD testing  P:   Mammogram scheduled for September.  This is a follow up pap smear and HR HPV obtained today Gc/Chl, HIV, RPR today Affirm Referral for colonoscopy Diflucan 150mg  po x 1. Repeat 72 hours.  #2/0RF Continue Vivelle dot patch 0.0375mg .  Pt is going to cut this in half to decrease dosage.  Rx to pharmacy.  #24/4RF Prometrium 100mg  daily.  #90/4RF. Premarin cream 1/2 grm pv weekly only.  Rx to pharamcy.   return annually or prn

## 2016-03-22 NOTE — Addendum Note (Signed)
Addended by: Megan Salon on: 03/22/2016 11:30 AM   Modules accepted: Orders

## 2016-03-22 NOTE — Addendum Note (Signed)
Addended by: Polly Cobia on: 03/22/2016 09:51 AM   Modules accepted: Orders

## 2016-03-23 LAB — WET PREP BY MOLECULAR PROBE
Candida species: NEGATIVE
GARDNERELLA VAGINALIS: POSITIVE — AB
Trichomonas vaginosis: NEGATIVE

## 2016-03-23 LAB — HIV ANTIBODY (ROUTINE TESTING W REFLEX): HIV 1&2 Ab, 4th Generation: NONREACTIVE

## 2016-03-23 LAB — HEPATITIS C ANTIBODY: HCV Ab: NEGATIVE

## 2016-03-23 LAB — RPR

## 2016-03-23 MED ORDER — METRONIDAZOLE 0.75 % VA GEL: 1.0000 | Freq: Every day | VAGINAL | 0 refills | Status: DC

## 2016-03-23 NOTE — Addendum Note (Signed)
Addended by: Megan Salon on: 03/23/2016 06:21 PM   Modules accepted: Orders

## 2016-03-26 LAB — IPS N GONORRHOEA AND CHLAMYDIA BY PCR

## 2016-03-26 LAB — IPS PAP TEST WITH HPV

## 2016-04-05 DIAGNOSIS — F4322 Adjustment disorder with anxiety: Secondary | ICD-10-CM | POA: Diagnosis not present

## 2016-04-10 ENCOUNTER — Telehealth: Payer: Self-pay | Admitting: Obstetrics & Gynecology

## 2016-04-10 MED ORDER — FLUCONAZOLE 150 MG PO TABS: ORAL_TABLET | ORAL | 0 refills | Status: DC

## 2016-04-10 MED ORDER — METRONIDAZOLE 500 MG PO TABS: 500.0000 mg | ORAL_TABLET | Freq: Two times a day (BID) | ORAL | 0 refills | Status: DC

## 2016-04-10 NOTE — Telephone Encounter (Signed)
Patient is asking to talk with Dr.Miller's nurse. Patient did not want to give details, only that it is a follow up issue.

## 2016-04-10 NOTE — Telephone Encounter (Signed)
Reviewed with Dr [name], may try Flagyl 500 mg BID for 7 days as well as Diflucan 150 mg Disp 2 tabs no refills on either. If symptoms not resolved will need office visit.     Call back to patient. Advised medications sent and alcohol precautions while on Flagyl and 3 days following. Advised will need office visit if symptoms not resolved.  Routing to provider for final review. Patient agreeable to disposition. Will close encounter.

## 2016-04-10 NOTE — Telephone Encounter (Signed)
Call to patient. Advised Dr [name] recommends treating with Diflucan for yeast since just completed Metrogel. Patient reports she has already treated with one dose of Diflucan and yeast symptoms have improved. States current discharge is more consistent with her bacterial infection. White watery discharge with burning. States she has discussed with Dr [name] that Flagyl is more reliable than the Metrogel and feels maybe she needs this instead.   States when first completed Metrogel, she did initially have yeast type symptoms of white clumpy discharge but this has resolved.  Requests I check with Dr [name] and see if she should be re-treated with Flagyl in addition to Diflucan.  Request RX to [facility] out patient pharmacy.

## 2016-04-10 NOTE — Telephone Encounter (Signed)
Returned call to patient. Patient states that Dr. [name] has been treating her for ongoing vaginosis for about a year. Patient states she was here for her AEX on 08/24 and Dr. [name] treated her with the vaginal gel then because flagyl upsets her stomach. Patient states she has recently completed the treatment, but that she feels her symptoms are coming back. States she is having burning and white discharge. Office visit offered, but patient states, "Dr. [name] swabbed me for everything during my physical and I know what this is." Patient states she will take Flagyl if Dr. [name] thinks that's what she needs to do to cure this. RN advised this message would be sent to Dr. [name] for review and our office would call with any additional recommendations. Patient aware Dr. [name] is seeing patient's today, so a response will likely not be immediate.  Preferred pharmacy [facility] Monday- Thursday only.    Routing to provider for review.

## 2016-04-10 NOTE — Telephone Encounter (Signed)
This is likely yeast since she just finished the metrogel.  Needs diflucan 150mg  po 1 , repeat 72 hours.  #3/0RF

## 2016-04-11 DIAGNOSIS — F4322 Adjustment disorder with anxiety: Secondary | ICD-10-CM | POA: Diagnosis not present

## 2016-05-04 DIAGNOSIS — F4322 Adjustment disorder with anxiety: Secondary | ICD-10-CM | POA: Diagnosis not present

## 2016-05-11 DIAGNOSIS — F4322 Adjustment disorder with anxiety: Secondary | ICD-10-CM | POA: Diagnosis not present

## 2016-05-19 DIAGNOSIS — F4322 Adjustment disorder with anxiety: Secondary | ICD-10-CM | POA: Diagnosis not present

## 2016-05-22 ENCOUNTER — Telehealth: Payer: Self-pay | Admitting: *Deleted

## 2016-05-22 NOTE — Telephone Encounter (Signed)
Please contact patient regarding 04 recall. She was due in September for F/U MM Thanks

## 2016-05-28 NOTE — Telephone Encounter (Signed)
Message left to return call to [facility] at [phone] to discuss MMG scheduling.

## 2016-05-31 DIAGNOSIS — Z0279 Encounter for issue of other medical certificate: Secondary | ICD-10-CM

## 2016-06-01 NOTE — Telephone Encounter (Signed)
Message left to return call to Alicia Alvarez at [phone].    

## 2016-06-04 NOTE — Telephone Encounter (Signed)
Patient has not returned call regarding 04 recall. Please advise recall status  Thanks Margaretha Sheffield

## 2016-06-04 NOTE — Telephone Encounter (Signed)
Send MMG letter and remove from recall.

## 2016-06-05 ENCOUNTER — Encounter: Payer: Self-pay | Admitting: *Deleted

## 2016-06-05 DIAGNOSIS — F4322 Adjustment disorder with anxiety: Secondary | ICD-10-CM | POA: Diagnosis not present

## 2016-06-05 NOTE — Telephone Encounter (Signed)
Letter sent to patient. Patient removed from recall-eh

## 2016-06-14 ENCOUNTER — Telehealth: Payer: Self-pay | Admitting: Obstetrics & Gynecology

## 2016-06-14 NOTE — Telephone Encounter (Signed)
Patient was to have a breast ultrasound back in the summer and never had it done.  Want to speak with someone about weather she should get it done now or have her annual mammogram done.

## 2016-06-14 NOTE — Telephone Encounter (Signed)
Patient had a screening mammogram on 03/31/2015 with [facility]. Bilateral breast ultrasounds were recommended. Patient did not follow up. Spoke with [place] at [facility] who states since it has been over 6 months the patient may have a screening mammogram, bilateral diagnostic imaging since there are cysts present bilaterally, or bilateral ultrasounds. Advised I will review with the physician and send in order for scheduling.

## 2016-06-15 NOTE — Telephone Encounter (Signed)
If they will just schedule a screening mammogram, that is what I would start with at this point.  If it requires something diagnostic, then I would just plan bilateral diagnostic MMG.  Thanks.

## 2016-06-15 NOTE — Telephone Encounter (Signed)
Spoke with patient. Advised I have reviewed recommendations regarding imaging with [facility] and Dr.Miller and order for bilateral screening mammogram has been sent to [facility]. Advised she may contact [facility] at her earliest convenience to schedule her appointment. Patient is agreeable.  Routing to provider for final review. Patient agreeable to disposition. Will close encounter.

## 2016-06-19 DIAGNOSIS — F4322 Adjustment disorder with anxiety: Secondary | ICD-10-CM | POA: Diagnosis not present

## 2016-07-03 DIAGNOSIS — F4322 Adjustment disorder with anxiety: Secondary | ICD-10-CM | POA: Diagnosis not present

## 2016-07-09 ENCOUNTER — Encounter: Payer: Self-pay | Admitting: Family Medicine

## 2016-07-09 ENCOUNTER — Ambulatory Visit (INDEPENDENT_AMBULATORY_CARE_PROVIDER_SITE_OTHER): Payer: 59 | Admitting: Family Medicine

## 2016-07-09 VITALS — BP 106/76 | HR 73 | Wt 135.0 lb

## 2016-07-09 DIAGNOSIS — M7581 Other shoulder lesions, right shoulder: Secondary | ICD-10-CM

## 2016-07-09 DIAGNOSIS — M25511 Pain in right shoulder: Secondary | ICD-10-CM | POA: Diagnosis not present

## 2016-07-09 MED ORDER — LIDOCAINE HCL 1 % IJ SOLN
5.0000 mL | Freq: Once | INTRAMUSCULAR | Status: AC
  Administered 2016-07-09: 5 mL via INTRADERMAL

## 2016-07-09 MED ORDER — TRIAMCINOLONE ACETONIDE 40 MG/ML IJ SUSP
40.0000 mg | Freq: Once | INTRAMUSCULAR | Status: AC
  Administered 2016-07-09: 40 mg via INTRAMUSCULAR

## 2016-07-09 NOTE — Progress Notes (Signed)
   Subjective:    Patient ID: Alicia Alvarez, female    DOB: November 16, 1950, 65 y.o.   MRN: [id number]  HPI She is here for evaluation of continued difficulty with right shoulder pain. She did have an injection several months ago and did well for 3 months. She was given physical therapy however she did not and tinea with this. She is swimming.   Review of Systems     Objective:   Physical Exam Full motion of the shoulder. Drop arm test was uncomfortable. Supraspinatus testing again was uncomfortable. No joint laxity is noted. Negative sulcus test. Neer's and Hawkins test was uncomfortable.       Assessment & Plan:  Right shoulder pain, unspecified chronicity - Plan: lidocaine (XYLOCAINE) 1 % (with pres) injection 5 mL, triamcinolone acetonide (KENALOG-40) injection 40 mg  Rotator cuff tendinitis, right He would like an injection which I gave. She tolerated the procedure well. Demonstrated proper exercise techniques with her. If continued difficulty, I will refer to orthopedics.

## 2016-07-11 ENCOUNTER — Ambulatory Visit: Payer: Self-pay | Admitting: Physician Assistant

## 2016-07-11 ENCOUNTER — Encounter: Payer: Self-pay | Admitting: Physician Assistant

## 2016-07-11 VITALS — BP 130/90 | HR 76 | Temp 97.9°F

## 2016-07-11 DIAGNOSIS — H6983 Other specified disorders of Eustachian tube, bilateral: Secondary | ICD-10-CM

## 2016-07-11 MED ORDER — PREDNISONE 10 MG PO TABS: 30.0000 mg | ORAL_TABLET | Freq: Every day | ORAL | 0 refills | Status: DC

## 2016-07-11 MED ORDER — FLUTICASONE PROPIONATE 50 MCG/ACT NA SUSP: 2.0000 | Freq: Every day | NASAL | 6 refills | Status: DC

## 2016-07-11 NOTE — Progress Notes (Signed)
S:  C/o ears popping and being stopped up, no drainage from ears, no fever/chills, no cough or congestion, some sinus pressure, remainder ros neg Using otc meds without relief  O:  Vitals wnl, nad, tms dull b/l, nasal mucosa swollen, throat wnl, neck supple no lymph, lungs c t a, cv rrr, neuro intact  A: acute eustachean tube dysfunction  P: flonase, sudafed, prednisone 30mg  qd x 3d, return if not improving in 3 to 5 days, return earlier if worsening

## 2016-07-18 ENCOUNTER — Ambulatory Visit: Payer: Self-pay | Admitting: Physician Assistant

## 2016-07-18 ENCOUNTER — Encounter: Payer: Self-pay | Admitting: Physician Assistant

## 2016-07-18 VITALS — BP 110/80 | HR 70 | Temp 98.0°F

## 2016-07-18 DIAGNOSIS — H6980 Other specified disorders of Eustachian tube, unspecified ear: Secondary | ICD-10-CM

## 2016-07-18 DIAGNOSIS — H6990 Unspecified Eustachian tube disorder, unspecified ear: Secondary | ICD-10-CM

## 2016-07-18 MED ORDER — PREDNISONE 10 MG PO TABS: 30.0000 mg | ORAL_TABLET | Freq: Every day | ORAL | 0 refills | Status: DC

## 2016-07-18 NOTE — Progress Notes (Signed)
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

## 2016-08-06 DIAGNOSIS — Z1231 Encounter for screening mammogram for malignant neoplasm of breast: Secondary | ICD-10-CM | POA: Diagnosis not present

## 2016-08-09 ENCOUNTER — Encounter: Payer: Self-pay | Admitting: Obstetrics & Gynecology

## 2016-09-12 ENCOUNTER — Telehealth: Payer: Self-pay | Admitting: Obstetrics & Gynecology

## 2016-09-12 NOTE — Telephone Encounter (Signed)
Spoke with patient. Patient states she would like Dr. [name] to clarify progesterone dosage. Patient states at last AEX 02/2016 she discussed cutting progesterone back and not taking daily. Patient states a couple of months ago she experiences breast tenderness and cut back on the progesterone only taking daily for 3 weeks and skipping a week. Patient states the tenderness was around time of MMG, maybe related to MMG or not but tenderness went away. Patient states she did this for 2 months and returned to taking daily for 4 weeks. Patient states she has occasional night sweats, but not sure if related to hormones, spike in insulin or down comforter. Patient denies any breast tenderness.  Reviewed plan form last AEX as seen below, advised to take progesterone 100 mg daily and cut Vivelle dot in half to decrease dosage. Patient states she has been cutting patch in half.  Advised patient Dr. [name] is out of the office today, can review 09/13/16 an return call with recommendations. Patient sates she would like to know if any changes need to be made to progesterone dosing or cut back? Advised will return call with recommendations, patient is agreeable.   Dr. [name] -please advise on progesterone?   P:         Mammogram scheduled for September.  This is a follow up pap smear and HR HPV obtained today Gc/Chl, HIV, RPR today Affirm Referral for colonoscopy Diflucan 150mg  po x 1. Repeat 72 hours.  #2/0RF Continue Vivelle dot patch 0.0375mg .  Pt is going to cut this in half to decrease dosage.  Rx to pharmacy.  #24/4RF Prometrium 100mg  daily.  #90/4RF. Premarin cream 1/2 grm pv weekly only.  Rx to pharamcy.   return annually or prn

## 2016-09-12 NOTE — Telephone Encounter (Signed)
Patient has a question about her Prometrium dosage and when she is supposed to be taking it.

## 2016-09-14 NOTE — Telephone Encounter (Signed)
The 100mg  of the prometrium is the lowest dosage there is for this medication.  So, the only way to "cut back" is take it days 1-21 each month.  If wants to take something lower, would need to switch to another medication.  My recommendation currently is to take the Prometrium 100mg  daily.

## 2016-09-14 NOTE — Telephone Encounter (Signed)
Spoke with patient, advised as seen below per Dr. [name]. Patient states she will continue to take Prometrium 100mg  daily. Patient thankful for return call, verbalizes understanding and is agreeable.  Routing to provider for final review. Patient is agreeable to disposition. Will close encounter.

## 2016-09-21 DIAGNOSIS — H5213 Myopia, bilateral: Secondary | ICD-10-CM | POA: Diagnosis not present

## 2016-09-21 DIAGNOSIS — H524 Presbyopia: Secondary | ICD-10-CM | POA: Diagnosis not present

## 2016-11-09 DIAGNOSIS — L820 Inflamed seborrheic keratosis: Secondary | ICD-10-CM | POA: Diagnosis not present

## 2016-11-09 DIAGNOSIS — Z85828 Personal history of other malignant neoplasm of skin: Secondary | ICD-10-CM | POA: Diagnosis not present

## 2016-11-09 DIAGNOSIS — D044 Carcinoma in situ of skin of scalp and neck: Secondary | ICD-10-CM | POA: Diagnosis not present

## 2016-11-09 DIAGNOSIS — L821 Other seborrheic keratosis: Secondary | ICD-10-CM | POA: Diagnosis not present

## 2016-11-09 DIAGNOSIS — C44712 Basal cell carcinoma of skin of right lower limb, including hip: Secondary | ICD-10-CM | POA: Diagnosis not present

## 2016-11-09 DIAGNOSIS — L570 Actinic keratosis: Secondary | ICD-10-CM | POA: Diagnosis not present

## 2016-11-09 DIAGNOSIS — D485 Neoplasm of uncertain behavior of skin: Secondary | ICD-10-CM | POA: Diagnosis not present

## 2016-11-15 ENCOUNTER — Encounter: Payer: Self-pay | Admitting: Family Medicine

## 2016-11-15 ENCOUNTER — Ambulatory Visit (INDEPENDENT_AMBULATORY_CARE_PROVIDER_SITE_OTHER): Payer: PPO | Admitting: Family Medicine

## 2016-11-15 VITALS — BP 100/60 | HR 73 | Wt 142.6 lb

## 2016-11-15 DIAGNOSIS — C4442 Squamous cell carcinoma of skin of scalp and neck: Secondary | ICD-10-CM

## 2016-11-15 DIAGNOSIS — H9221 Otorrhagia, right ear: Secondary | ICD-10-CM

## 2016-11-15 DIAGNOSIS — J309 Allergic rhinitis, unspecified: Secondary | ICD-10-CM | POA: Diagnosis not present

## 2016-11-15 DIAGNOSIS — H6983 Other specified disorders of Eustachian tube, bilateral: Secondary | ICD-10-CM | POA: Diagnosis not present

## 2016-11-15 NOTE — Patient Instructions (Signed)
Switch to Rhinocort and also then use either Allegra or Claritin regularly. Over-the-counter cortisone for the ear itching and no more Q-tips

## 2016-11-15 NOTE — Progress Notes (Signed)
   Subjective:    Patient ID: Alicia Alvarez, female    DOB: [birth year], 66 y.o.   MRN: [id number]  HPI For the last several months she has had difficulty with bilateral hearing and has been being treated for eustachian tube dysfunction. She was seen by a PA and given their weights and also recommended using Flonase. She also did occasionally use Afrin when she flies. She also complains of itching in her ears and has been using Q-tips. For complains of sneezing, itchy watery eyes, rhinorrhea but states that this is not any worse than normal. He also recently had 2 shave biopsies done. 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 Review of Systems     Objective:   Physical Exam alert and in no distress. Left canal does show some cerumen deep in the canal near the TM. Otherwise the canal and TM appear normal. Right TM was difficult to see. Inferior near the TM or on it are 3 round reddish brown lesions that could possibly be hematoma.      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 Did recommend using cortisone cream for the itching and only apply it with her fingertip. She will follow-up with dermatology concerning the 2 skin cancers.

## 2016-11-20 DIAGNOSIS — F17290 Nicotine dependence, other tobacco product, uncomplicated: Secondary | ICD-10-CM | POA: Diagnosis not present

## 2016-11-20 DIAGNOSIS — H938X3 Other specified disorders of ear, bilateral: Secondary | ICD-10-CM | POA: Diagnosis not present

## 2016-11-20 DIAGNOSIS — H6123 Impacted cerumen, bilateral: Secondary | ICD-10-CM | POA: Diagnosis not present

## 2016-11-20 DIAGNOSIS — L299 Pruritus, unspecified: Secondary | ICD-10-CM | POA: Diagnosis not present

## 2016-11-22 DIAGNOSIS — Z85828 Personal history of other malignant neoplasm of skin: Secondary | ICD-10-CM | POA: Diagnosis not present

## 2016-11-22 DIAGNOSIS — D044 Carcinoma in situ of skin of scalp and neck: Secondary | ICD-10-CM | POA: Diagnosis not present

## 2016-11-23 ENCOUNTER — Telehealth: Payer: Self-pay | Admitting: Obstetrics & Gynecology

## 2016-11-23 NOTE — Telephone Encounter (Signed)
Spoke with patient. Patient states she believes she has a yeast infection that has been ongoing for couple of months. Patient reports white, thick vaginal discharge without odor and itching. Patient denies urinary complaints or pain. Patient states has not used OTC treatment as Dr. [name] has advised in past not to use. Recommended OV for further evaluation, last OV 03/22/16. Patient states she needs a morning appointment and prefers Dr. [name]. Patient scheduled for 5/1 at 11:15am with Dr. [name]. Patient is agreeable to date and time.  Routing to provider for final review. Patient is agreeable to disposition. Will close encounter.

## 2016-11-23 NOTE — Telephone Encounter (Signed)
Left message to call Kaitlyn at [phone]. 

## 2016-11-23 NOTE — Telephone Encounter (Signed)
Patient states she has a yeast infection and wants something called into her pharmacy.  Offered to make her an appointment but she declined.

## 2016-11-23 NOTE — Telephone Encounter (Signed)
Patient requests [place], if needed.

## 2016-11-27 ENCOUNTER — Encounter: Payer: Self-pay | Admitting: Obstetrics & Gynecology

## 2016-11-27 ENCOUNTER — Ambulatory Visit (INDEPENDENT_AMBULATORY_CARE_PROVIDER_SITE_OTHER): Payer: PPO | Admitting: Obstetrics & Gynecology

## 2016-11-27 VITALS — BP 100/70 | HR 64 | Resp 12 | Ht 66.75 in | Wt 143.2 lb

## 2016-11-27 DIAGNOSIS — N898 Other specified noninflammatory disorders of vagina: Secondary | ICD-10-CM

## 2016-11-27 NOTE — Progress Notes (Signed)
GYNECOLOGY  VISIT   HPI: 66 y.o. [id number] Divorced Caucasian female here for complaint of vaginal discharge that has been present for the last week.  She has been experiencing symptoms with yeast or BV about twice a year but she states today she wonders if some of her symptoms are not persistent and not just episodic.  D/W pt possibly doing TOC to make sure current issue resolves.   Not currently SA.  4+ year relationship ended last year.  Denies vaginal bleeding or urinary symptoms.  Denies pelvic pain.  GYNECOLOGIC HISTORY: Patient's last menstrual period was 07/30/2002. Contraception: PMP status Menopausal hormone therapy: vivelle patch and prometrium  Patient Active Problem List   Diagnosis Date Noted  . Intramural leiomyoma of uterus 09/25/2015    Past Medical History:  Diagnosis Date  . Ankle fracture    right - no surgery required  . 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 Hemorrhoids    external    Past Surgical History:  Procedure Laterality Date  . adnoidectomy     as child  . [place]   x 1  . DILATATION & CURETTAGE/HYSTEROSCOPY WITH MYOSURE N/A 10/10/2015   Procedure: DILATATION & CURETTAGE/HYSTEROSCOPY WITH MYOSURE;  Surgeon: Megan Salon, MD;  Location: [facility];  Service: Gynecology;  Laterality: N/A;  . DILATION AND CURETTAGE OF UTERUS     MAB in 1993 or 1994    MEDS:  Reviewed in EPIC and UTD  ALLERGIES: Macrodantin [nitrofurantoin] and Sulfa antibiotics  Family History  Problem Relation Age of Onset  . Cancer Father     PROSTATE    SH:  Divorced, non smoker  Review of Systems  Genitourinary:       Vaginal discharge  All other systems reviewed and are negative.   PHYSICAL EXAMINATION:    BP 100/70 (BP Location: Right Arm, Patient Position: Sitting, Cuff Size: Normal)   Pulse 64   Resp 12   Ht 5' 6.75" (1.695 m)   Wt 143 lb 3.2 oz (65 kg)   LMP 07/30/2002   BMI 22.60 kg/m     General appearance: alert, cooperative  and appears stated age  Pelvic: External genitalia:  no lesions              Urethra:  normal appearing urethra with no masses, tenderness or lesions              Bartholins and Skenes: normal                 Vagina: normal appearing vagina with normal color, white thick discharge noted              Cervix: no lesions              Bimanual Exam:  Uterus:  normal size, contour, position, consistency, mobility, non-tender              Adnexa: no mass, fullness, tenderness              Anus: no lesions  Chaperone was present for exam.  Assessment: Vaginal discharge  Plan: Affirm pending.  Will wait for results before treating.  This should be back in 24 hours.  Depending on results, may consider repeat test for cure.  Pt will consider.

## 2016-11-28 ENCOUNTER — Other Ambulatory Visit: Payer: Self-pay | Admitting: *Deleted

## 2016-11-28 LAB — WET PREP BY MOLECULAR PROBE
CANDIDA SPECIES: NOT DETECTED
Gardnerella vaginalis: DETECTED — AB
TRICHOMONAS VAG: NOT DETECTED

## 2016-11-28 MED ORDER — METRONIDAZOLE 500 MG PO TABS: 500.0000 mg | ORAL_TABLET | Freq: Two times a day (BID) | ORAL | 0 refills | Status: DC

## 2016-12-04 ENCOUNTER — Encounter: Payer: Self-pay | Admitting: Family Medicine

## 2016-12-04 ENCOUNTER — Ambulatory Visit (INDEPENDENT_AMBULATORY_CARE_PROVIDER_SITE_OTHER): Payer: PPO | Admitting: Family Medicine

## 2016-12-04 VITALS — BP 122/82

## 2016-12-04 DIAGNOSIS — M25511 Pain in right shoulder: Secondary | ICD-10-CM

## 2016-12-04 MED ORDER — TRIAMCINOLONE ACETONIDE 40 MG/ML IJ SUSP
40.0000 mg | Freq: Once | INTRAMUSCULAR | Status: AC
  Administered 2016-12-04: 40 mg via INTRAMUSCULAR

## 2016-12-04 MED ORDER — LIDOCAINE HCL 2 % IJ SOLN
3.0000 mL | Freq: Once | INTRAMUSCULAR | Status: AC
  Administered 2016-12-04: 60 mg via INTRADERMAL

## 2016-12-04 NOTE — Addendum Note (Signed)
Addended by: Randel Books on: 12/04/2016 04:40 PM   Modules accepted: Orders

## 2016-12-04 NOTE — Progress Notes (Signed)
   Subjective:    Patient ID: Alicia Alvarez, female    DOB: Jul 25, 1951, 66 y.o.   MRN: [id number]  HPI She is here for reevaluation of right shoulder pain. She states that the last injection did last 3-4 months. When her shoulder started to feel back to normal, she started swimming again at her normal pace. She was doing some shoulder rehabilitation but not To the up with any regularity. MRI in the last year did show some tendinopathy but no other major issues. She would like to have another injection.  Review of Systems     Objective:   Physical Exam Alert and in no distress. Full motion of the shoulder. Neer's and Hawkins test was uncomfortable. Negative drop arm test.       Assessment & Plan:  Right shoulder pain, unspecified chronicity - Plan: lidocaine (XYLOCAINE) 2 % (with pres) injection 60 mg, triamcinolone acetonide (KENALOG-40) injection 40 mg, Ambulatory referral to Physical Therapy Shoulder was prepped in the posterior lateral portion with Betadine. 40 mg of Kenalog and 3 mL of Xylocaine was injected into the subacromial bursa without difficulty. She did obtain relatively quick relief of some of her symptoms. I encouraged her to get back involved in physical therapy and take with this for an extended period of time essentially continuing this forever. If she continues have difficulty with the shoulder pain, I will refer to orthopedics for their evaluation and treatment.

## 2016-12-11 ENCOUNTER — Ambulatory Visit (INDEPENDENT_AMBULATORY_CARE_PROVIDER_SITE_OTHER): Payer: PPO | Admitting: Obstetrics & Gynecology

## 2016-12-11 ENCOUNTER — Encounter: Payer: Self-pay | Admitting: Obstetrics & Gynecology

## 2016-12-11 ENCOUNTER — Telehealth: Payer: Self-pay | Admitting: Obstetrics & Gynecology

## 2016-12-11 VITALS — BP 122/80 | HR 76 | Temp 97.8°F | Resp 16 | Ht 66.75 in | Wt 143.0 lb

## 2016-12-11 DIAGNOSIS — N898 Other specified noninflammatory disorders of vagina: Secondary | ICD-10-CM | POA: Diagnosis not present

## 2016-12-11 NOTE — Progress Notes (Signed)
GYNECOLOGY  VISIT   HPI: 66 y.o. G39P0021 Divorced Caucasian female here for complaint of continued vaginal discharge.  She has been diagnosed with BV twice in the last 8 months.  She is not sure it has ever totally cleared up but she has trouble knowing if her symptoms are BV or yeast.  Today she's wondering if it is yeast due to the amount of discharge that is present.  Denies pelvic pain or abnormal urinary symptoms.  Denies new sexual partners.  Denies fever.  Denies itching.  Denies vaginal bleeding.  GYNECOLOGIC HISTORY: Patient's last menstrual period was 07/30/2002. Contraception: PMP Menopausal hormone therapy: Minivelle and Prometrium  Patient Active Problem List   Diagnosis Date Noted  . Intramural leiomyoma of uterus 09/25/2015    Past Medical History:  Diagnosis Date  . Ankle fracture    right - no surgery required  . 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 Hemorrhoids    external    Past Surgical History:  Procedure Laterality Date  . adnoidectomy     as child  . [place]   x 1  . DILATATION & CURETTAGE/HYSTEROSCOPY WITH MYOSURE N/A 10/10/2015   Procedure: DILATATION & CURETTAGE/HYSTEROSCOPY WITH MYOSURE;  Surgeon: Megan Salon, MD;  Location: [facility];  Service: Gynecology;  Laterality: N/A;  . DILATION AND CURETTAGE OF UTERUS     MAB in 1993 or 1994    MEDS:  Reviewed in EPIC and UTD  ALLERGIES: Macrodantin [nitrofurantoin] and Sulfa antibiotics  Family History  Problem Relation Age of Onset  . Cancer Father        PROSTATE    SH:  Divorced, non smoker  Review of Systems  Genitourinary:       Vaginal discharge  All other systems reviewed and are negative.   PHYSICAL EXAMINATION:    BP 122/80 (BP Location: Right Arm, Patient Position: Sitting, Cuff Size: Normal)   Pulse 76   Temp 97.8 F (36.6 C) (Oral)   Resp 16   Ht 5' 6.75" (1.695 m)   Wt 143 lb (64.9 kg)   LMP 07/30/2002   BMI 22.57 kg/m     General appearance:  alert, cooperative and appears stated age  Pelvic: External genitalia:  no lesions              Urethra:  normal appearing urethra with no masses, tenderness or lesions              Bartholins and Skenes: normal                 Vagina: normal appearing vagina with normal color and thick whitish discharge present, no lesions              Cervix: no lesions              Bimanual Exam:  Uterus:  normal size, contour, position, consistency, mobility, non-tender              Adnexa: no mass, fullness, tenderness              Anus:   no lesions  Chaperone was present for exam.  Assessment: Vaginal discharge, possibly recurrent or persistent BV  Plan: Affirm pending.  Will treat with metrogel if positive for BV.  Consider treatment for recurrent BV as well.  Pt in agreement with plan.

## 2016-12-11 NOTE — Telephone Encounter (Signed)
Returned patient call and left a message to call back and ask for triage nurse -eh

## 2016-12-11 NOTE — Telephone Encounter (Signed)
Patient called back, states she is still having heavy discharge. She has completed the full course of oral Flagyl. She is requesting to see Dr. [name] this AM. Advised that Dr. [name] will be in surgery late morning early afternoon. Offered for patient to see another provider. Patient declined. Offered 4:15 appointment today, patient agreed. -eh.

## 2016-12-11 NOTE — Telephone Encounter (Signed)
Patient was treated recently for bacterial vaginosis and is asking for a different prescription. Patient is asking for Flagyl "gel". Patient said her symptoms have not gone away completely and is hoping that the gel will take care of this.

## 2016-12-12 ENCOUNTER — Ambulatory Visit: Payer: 59

## 2016-12-12 ENCOUNTER — Telehealth: Payer: Self-pay | Admitting: *Deleted

## 2016-12-12 LAB — WET PREP BY MOLECULAR PROBE
Candida species: NOT DETECTED
GARDNERELLA VAGINALIS: DETECTED — AB
Trichomonas vaginosis: NOT DETECTED

## 2016-12-12 MED ORDER — METRONIDAZOLE 0.75 % VA GEL: VAGINAL | 6 refills | Status: DC

## 2016-12-12 NOTE — Telephone Encounter (Signed)
Left message to call Asia Favata at [phone].  

## 2016-12-12 NOTE — Telephone Encounter (Signed)
Spoke with patient, advised of results and recommendations as seen below per Dr. [name]. Rx for metrogel to verified pharmacy on file. Patient unable to schedule 3 month f/u, will return call. Patient verbalizes understanding and is agreeable.

## 2016-12-12 NOTE — Telephone Encounter (Signed)
-----   Message from Megan Salon, MD sent at 12/12/2016  1:22 PM EDT ----- Please let pt know her affirm testing still showed BV.  Would recommend treatment with Metrogel 0.75% nightly x 10 nights and then one applicator vaginally twice weekly for three months.  Would repeat testing after that time.  Please let her know I want to treat for 10 nights and not 5 this time as this is recommended as well for persistent BV.  Ok to call in rx with 6 RFs.  Needs recheck 3 months.

## 2016-12-12 NOTE — Telephone Encounter (Signed)
Patient returned your call.

## 2016-12-13 ENCOUNTER — Ambulatory Visit: Payer: Self-pay | Admitting: Obstetrics & Gynecology

## 2016-12-26 ENCOUNTER — Ambulatory Visit: Payer: PPO | Attending: Family Medicine

## 2016-12-26 DIAGNOSIS — M25511 Pain in right shoulder: Secondary | ICD-10-CM | POA: Diagnosis not present

## 2016-12-26 DIAGNOSIS — R293 Abnormal posture: Secondary | ICD-10-CM | POA: Insufficient documentation

## 2016-12-26 DIAGNOSIS — G8929 Other chronic pain: Secondary | ICD-10-CM | POA: Diagnosis not present
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
## 2016-12-27 NOTE — Telephone Encounter (Signed)
Spoke with patient. Patient states she has completed 10 days of metrogel, getting ready to start twice weekly. Patient reports symptoms have not improved or worsened with treatment thus far. Patient reports vaginal itching, thick white discharge and burning with urination. Patient states she feels like burning is internal, possibly tissue. Recommended OV for further evaluation with Dr. [name], patient scheduled for 12/28/16 at 1:30pm. Patient ask that Dr. [name] review and if she feels appointment is unnecessary and just need to give medication some time to be effective she would like return call. Advised patient would review with Dr. [name] and return call with any additional recommendations, patient is agreeable.   Dr. [name] -please review, any additional recommendations?

## 2016-12-27 NOTE — Telephone Encounter (Signed)
Patient still having same symptoms and would like to speak with a nurse.

## 2016-12-27 NOTE — Telephone Encounter (Signed)
Left message to call Ott Zimmerle at [phone].  

## 2016-12-27 NOTE — Telephone Encounter (Signed)
Agree with appt.  Ok to close encounter.

## 2016-12-28 ENCOUNTER — Ambulatory Visit (INDEPENDENT_AMBULATORY_CARE_PROVIDER_SITE_OTHER): Payer: PPO | Admitting: Obstetrics & Gynecology

## 2016-12-28 ENCOUNTER — Other Ambulatory Visit: Payer: Self-pay | Admitting: Obstetrics & Gynecology

## 2016-12-28 VITALS — BP 112/76 | HR 88 | Resp 14 | Ht 66.75 in | Wt 143.0 lb

## 2016-12-28 DIAGNOSIS — N898 Other specified noninflammatory disorders of vagina: Secondary | ICD-10-CM | POA: Diagnosis not present

## 2016-12-28 DIAGNOSIS — N761 Subacute and chronic vaginitis: Secondary | ICD-10-CM

## 2016-12-28 MED ORDER — FLUCONAZOLE 150 MG PO TABS: 150.0000 mg | ORAL_TABLET | Freq: Once | ORAL | 0 refills | Status: AC

## 2016-12-28 NOTE — Progress Notes (Signed)
GYNECOLOGY  VISIT   HPI: 66 y.o. G4P0021 Divorced Caucasian female here for complaint of vaginal discharge.  Pt has been diagnosed with BV three times in the last 8 months.  Most recently, it was 12/11/16.  She was treated with metrogel and was going to start treatment for recurrent BV but reports she feels the discharge has changed and is now white and clumpy and there is associated itching.  This is different for her.  Denies urinary complaints.  Not SA.  Denies pelvic pain or fever.  GYNECOLOGIC HISTORY: Patient's last menstrual period was 07/30/2002. Contraception: Post menopausal  Menopausal hormone therapy: None  Patient Active Problem List   Diagnosis Date Noted  . Intramural leiomyoma of uterus 09/25/2015    Past Medical History:  Diagnosis Date  . Ankle fracture    right - no surgery required  . 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 Hemorrhoids    external    Past Surgical History:  Procedure Laterality Date  . adnoidectomy     as child  . [place]   x 1  . DILATATION & CURETTAGE/HYSTEROSCOPY WITH MYOSURE N/A 10/10/2015   Procedure: DILATATION & CURETTAGE/HYSTEROSCOPY WITH MYOSURE;  Surgeon: Megan Salon, MD;  Location: [facility];  Service: Gynecology;  Laterality: N/A;  . DILATION AND CURETTAGE OF UTERUS     MAB in 1993 or 1994    MEDS:  Reviewed in EPIC and UTD  ALLERGIES: Macrodantin [nitrofurantoin] and Sulfa antibiotics  Family History  Problem Relation Age of Onset  . Cancer Father        PROSTATE    SH:  Divorced, non smoker  Review of Systems  Genitourinary:       Vaginal discharge   All other systems reviewed and are negative.   PHYSICAL EXAMINATION:    BP 112/76 (BP Location: Right Arm, Patient Position: Sitting, Cuff Size: Normal)   Pulse 88   Resp 14   Ht 5' 6.75" (1.695 m)   Wt 143 lb (64.9 kg)   LMP 07/30/2002   BMI 22.57 kg/m     General appearance: alert, cooperative and appears stated age  Pelvic: External  genitalia:  no lesions              Urethra:  normal appearing urethra with no masses, tenderness or lesions              Bartholins and Skenes: normal                 Vagina: normal appearing vagina with normal color, white and clumpy discharge noted, no odor and no lesions              Cervix: no lesions              Bimanual Exam:  Uterus:  normal size, contour, position, consistency, mobility, non-tender              Adnexa: no mass, fullness, tenderness              Anus:  no lesions  Chaperone was present for exam.  Assessment: Recurrent BV  Plan: Affirm pending.  If this still showed BV, will test sureswab vaginitis/vaginosis to see if can determine bacteria causing symptoms.   Diflucan 150mg  po x 1, repeat 72 hours.

## 2016-12-28 NOTE — Telephone Encounter (Signed)
Spoke with patient, advised as seen below per Dr. [name]. Patient verbalizes understanidng and is agreeable.   Routing to provider for final review. Patient is agreeable to disposition. Will close encounter.

## 2016-12-30 ENCOUNTER — Encounter: Payer: Self-pay | Admitting: Obstetrics & Gynecology

## 2016-12-30 LAB — VAGINITIS/VAGINOSIS, DNA PROBE
Candida Species: NEGATIVE
Gardnerella vaginalis: POSITIVE — AB
Trichomonas vaginosis: NEGATIVE

## 2016-12-31 DIAGNOSIS — N761 Subacute and chronic vaginitis: Secondary | ICD-10-CM | POA: Diagnosis not present

## 2016-12-31 NOTE — Addendum Note (Signed)
Addended by: Megan Salon on: 12/31/2016 01:55 PM   Modules accepted: Orders

## 2016-12-31 NOTE — Addendum Note (Signed)
Addended by: Polly Cobia on: 12/31/2016 11:51 AM   Modules accepted: Orders

## 2016-12-31 NOTE — Addendum Note (Signed)
Addended by: Polly Cobia on: 12/31/2016 04:29 PM   Modules accepted: Orders

## 2016-12-31 NOTE — Addendum Note (Signed)
Addended by: Polly Cobia on: 12/31/2016 02:29 PM   Modules accepted: Orders

## 2017-01-01 ENCOUNTER — Telehealth: Payer: Self-pay | Admitting: Obstetrics & Gynecology

## 2017-01-01 MED ORDER — TINIDAZOLE 500 MG PO TABS: ORAL_TABLET | ORAL | 0 refills | Status: DC

## 2017-01-01 NOTE — Telephone Encounter (Signed)
Patient is asking for her recent labs. Patient said "I thought the results were supposed to be back yesterday."

## 2017-01-01 NOTE — Telephone Encounter (Signed)
Routing to [place] for review of results from 12/28/2016. Okay to inform patient her testing for BV returned positive and treat with Flagyl or Metrogel?

## 2017-01-01 NOTE — Addendum Note (Signed)
Addended by: Megan Salon on: 01/01/2017 05:16 PM   Modules accepted: Orders

## 2017-01-02 ENCOUNTER — Telehealth: Payer: Self-pay | Admitting: Obstetrics & Gynecology

## 2017-01-02 ENCOUNTER — Ambulatory Visit: Payer: PPO

## 2017-01-02 NOTE — Telephone Encounter (Signed)
Patient called again asks that Dr [name] please return her call.

## 2017-01-02 NOTE — Telephone Encounter (Signed)
Called pt personally.  No answer.  Reviewed DPR and detailed message left for her.  Recommended treatment with tindamax and rx was sent the pharmacy for her.  Nu-swab has also been sent and is pending.  Advised pt would call with this result and make additional recommendations at that time.  Ok to close encounter.

## 2017-01-02 NOTE — Telephone Encounter (Signed)
Patient called back states she must speak with Dr [name] about yesterday's discussion and the antibiotics.  States she will try her again tomorrow.

## 2017-01-02 NOTE — Telephone Encounter (Signed)
Patient left voicemail that she is returning Dr Ammie Ferrier call from yesterday evening.  States she needs to speak with you ASAP regarding treatment.

## 2017-01-03 NOTE — Telephone Encounter (Signed)
Patient called again requesting to speak with Dr [name] about some lab results.  States she will not take the antibiotic until she speaks with Dr [name] about the results.

## 2017-01-04 NOTE — Telephone Encounter (Signed)
Spoke with pt today.  She has started the Tindamax today.  The second test isn't back.  Advised her of this result.  Will contact her next week.

## 2017-01-06 LAB — BACTERIAL VAGINOSIS, NAA

## 2017-01-09 ENCOUNTER — Ambulatory Visit: Payer: PPO | Attending: Family Medicine

## 2017-01-09 DIAGNOSIS — M25511 Pain in right shoulder: Secondary | ICD-10-CM | POA: Insufficient documentation

## 2017-01-09 DIAGNOSIS — G8929 Other chronic pain: Secondary | ICD-10-CM | POA: Insufficient documentation

## 2017-01-09 DIAGNOSIS — R293 Abnormal posture: Secondary | ICD-10-CM
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
## 2017-01-11 ENCOUNTER — Other Ambulatory Visit: Payer: Self-pay | Admitting: Obstetrics & Gynecology

## 2017-01-11 DIAGNOSIS — N951 Menopausal and female climacteric states: Secondary | ICD-10-CM

## 2017-01-11 MED ORDER — ESTRADIOL 0.0375 MG/24HR TD PTTW: 1.0000 | MEDICATED_PATCH | TRANSDERMAL | 1 refills | Status: DC

## 2017-01-11 MED ORDER — PROGESTERONE MICRONIZED 100 MG PO CAPS: 100.0000 mg | ORAL_CAPSULE | Freq: Every day | ORAL | 1 refills | Status: DC

## 2017-01-14 ENCOUNTER — Telehealth: Payer: Self-pay | Admitting: Obstetrics & Gynecology

## 2017-01-14 LAB — CANDIDA 6 SPECIES PROFILE, NAA
CANDIDA LUSITANIAE, NAA: NEGATIVE
Candida albicans, NAA: POSITIVE — AB
Candida glabrata, NAA: NEGATIVE
Candida krusei, NAA: NEGATIVE
Candida parapsilosis, NAA: NEGATIVE
Candida tropicalis, NAA: NEGATIVE

## 2017-01-14 LAB — SPECIMEN STATUS REPORT

## 2017-01-14 MED ORDER — TERCONAZOLE 0.4 % VA CREA: 1.0000 | TOPICAL_CREAM | Freq: Every day | VAGINAL | 0 refills | Status: DC

## 2017-01-14 MED FILL — PROGESTERONE 100 MG CAPSULE: 100 | 90 days supply | Qty: 90 | Fill #0

## 2017-01-14 MED FILL — TERCONAZOLE 0.4% VAG CREAM: 0.4 | 7 days supply | Qty: 45 | Fill #0

## 2017-01-14 NOTE — Telephone Encounter (Signed)
Patient called to follow up with the nurse. She spoke with Dr. [name] late Friday and was to call in today if her had further concerns. Patient requests a call back this morning as she is hard to reach in the afternoon.

## 2017-01-14 NOTE — Telephone Encounter (Signed)
Yes, the swab showed one of the yeast types.  Would recommend treatment with Terazol 7 nightly x 7 nights.  One applicator full.  I would recommend repeating testing in 10-14 days to make sure fully resolved.

## 2017-01-14 NOTE — Telephone Encounter (Signed)
Spoke with patient, advised as seen below per Dr. [name]. RX for terazol7 to verified pharmacy on file. Patient scheduled for f/u OV on 01/24/17 at 10:15am with Dr. [name]. Patient verbalizes understanding and is agreeable.  Routing to provider for final review. Patient is agreeable to disposition. Will close encounter.

## 2017-01-14 NOTE — Telephone Encounter (Signed)
Spoke with patient. Patient states she spoke with Dr. [name] on  Friday regarding recent lab results, was still waiting on additional yeast testing. Patient asking if that result is back? Patient states vaginal discharge started again this weekend, seems different, reports as light to grey, thick, no odor and a little vaginal itching. Patient states she and Dr. [name] discussed possible treatment options, waiting on additional results. Advised patient Dr. [name] is out of the office this morning, will review when she return and return call with recommendations. Patient states she is available until 1pm, leave detailed message if later than that.   Dr. [name] -please review and advise?

## 2017-01-15 ENCOUNTER — Ambulatory Visit: Payer: PPO

## 2017-01-15 DIAGNOSIS — R293 Abnormal posture: Secondary | ICD-10-CM

## 2017-01-15 DIAGNOSIS — M25511 Pain in right shoulder: Principal | ICD-10-CM

## 2017-01-15 DIAGNOSIS — G8929 Other chronic pain: Secondary | ICD-10-CM
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
## 2017-01-24 ENCOUNTER — Encounter: Payer: Self-pay | Admitting: Obstetrics & Gynecology

## 2017-01-24 ENCOUNTER — Ambulatory Visit (INDEPENDENT_AMBULATORY_CARE_PROVIDER_SITE_OTHER): Payer: PPO | Admitting: Obstetrics & Gynecology

## 2017-01-24 ENCOUNTER — Ambulatory Visit: Payer: PPO

## 2017-01-24 VITALS — BP 118/80 | HR 68 | Resp 14 | Ht 66.75 in | Wt 143.0 lb

## 2017-01-24 DIAGNOSIS — G8929 Other chronic pain: Secondary | ICD-10-CM

## 2017-01-24 DIAGNOSIS — N898 Other specified noninflammatory disorders of vagina: Secondary | ICD-10-CM | POA: Diagnosis not present

## 2017-01-24 DIAGNOSIS — M25511 Pain in right shoulder: Principal | ICD-10-CM

## 2017-01-24 DIAGNOSIS — R293 Abnormal posture: Secondary | ICD-10-CM

## 2017-01-24 DIAGNOSIS — N761 Subacute and chronic vaginitis: Secondary | ICD-10-CM | POA: Diagnosis not present

## 2017-01-24 NOTE — Progress Notes (Signed)
GYNECOLOGY  VISIT   HPI: 66 y.o. G77P0021 Divorced Caucasian female here for follow up after being treated for candida.  Desires repeat testing to ensure this has resolved.  If so, wonders what is next as she is having a little discharge.  As I think she had a chronic yeast vaginitis that was undiagnosed, would recommend a 3 month course of vaginal estrogen cream.  Instructions given on use.  Pt comfortable with plan.  Denies pelvic pain, fever, urinary or bowel changes.  GYNECOLOGIC HISTORY: Patient's last menstrual period was 07/30/2002. Contraception: post menopausal  Menopausal hormone therapy: [place] patch twice weekly, progesterone 100 mg daily, premarin vag cream.   Patient Active Problem List   Diagnosis Date Noted  . Intramural leiomyoma of uterus 09/25/2015    Past Medical History:  Diagnosis Date  . Ankle fracture    right - no surgery required  . 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 Hemorrhoids    external    Past Surgical History:  Procedure Laterality Date  . adnoidectomy     as child  . [place]   x 1  . DILATATION & CURETTAGE/HYSTEROSCOPY WITH MYOSURE N/A 10/10/2015   Procedure: DILATATION & CURETTAGE/HYSTEROSCOPY WITH MYOSURE;  Surgeon: Megan Salon, MD;  Location: [facility];  Service: Gynecology;  Laterality: N/A;  . DILATION AND CURETTAGE OF UTERUS     MAB in 1993 or 1994    MEDS:  Reviewed in EPIC and UTD  ALLERGIES: Macrodantin [nitrofurantoin] and Sulfa antibiotics  Family History  Problem Relation Age of Onset  . Cancer Father        PROSTATE    SH:  Single, non smoker  Review of Systems  All other systems reviewed and are negative.   PHYSICAL EXAMINATION:    BP 118/80 (BP Location: Right Arm, Patient Position: Sitting, Cuff Size: Normal)   Pulse 68   Resp 14   Ht 5' 6.75" (1.695 m)   Wt 143 lb (64.9 kg)   LMP 07/30/2002   BMI 22.57 kg/m     General appearance: alert, cooperative and appears stated  age Abdomen: soft, non-tender; bowel sounds normal; no masses,  no organomegaly  Pelvic: External genitalia:  no lesions              Urethra:  normal appearing urethra with no masses, tenderness or lesions              Bartholins and Skenes: normal                 Vagina: normal appearing vagina with normal color and discharge, no lesions              Cervix: no lesions              Bimanual Exam:  Uterus:  normal size, contour, position, consistency, mobility, non-tender               Anus:   no lesions  Chaperone was present for exam.  Assessment: Vaginitis, chronic, now hopefully treated  Plan: Nuswab for C. Albicans and galbrata as well as BV testing is obtained.  Results and recommendations will be called to pt.
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
## 2017-01-30 LAB — NUSWAB BV AND CANDIDA, NAA
ATOPOBIUM VAGINAE: HIGH {score} — AB
CANDIDA ALBICANS, NAA: NEGATIVE
CANDIDA GLABRATA, NAA: NEGATIVE

## 2017-02-09 ENCOUNTER — Encounter: Payer: Self-pay | Admitting: Obstetrics & Gynecology

## 2017-02-10 ENCOUNTER — Encounter: Payer: Self-pay | Admitting: Obstetrics & Gynecology

## 2017-02-11 ENCOUNTER — Telehealth: Payer: Self-pay

## 2017-02-11 MED ORDER — METRONIDAZOLE 0.75 % VA GEL: VAGINAL | 3 refills | Status: DC

## 2017-02-11 NOTE — Telephone Encounter (Signed)
-----   Message from Megan Salon, MD sent at 02/08/2017  7:10 PM EDT ----- Called pt personally.  Reviewed DPR.  Left detailed message.  Yeast negative.  One part of test positive showing present of bacteria but not necessarily "infection or overgrowth".  If symptomatic, would recommend treatment with Metrogel one applicator full twice weekly for three months for suppression and then repeating test in 3 months.  Asked to send me a mychart message or call office on Monday for her desires.  Routing to triage as FYI.

## 2017-02-11 NOTE — Telephone Encounter (Signed)
Spoke with patient. Advised of message as seen below from [place]. Patient verbalizes understanding. Rx for Metrogel place 1 applicator vaginally twice weekly for 3 months sent to pharmacy on file. 3 month recheck scheduled for 05/17/2017 at 2:30 pm with Dr.Miller. Patient is agreeable to date and time.

## 2017-02-12 NOTE — Telephone Encounter (Signed)
Please see telephone call dated 02/11/2017.

## 2017-02-26 ENCOUNTER — Ambulatory Visit: Payer: PPO | Attending: Family Medicine

## 2017-02-26 DIAGNOSIS — G8929 Other chronic pain: Secondary | ICD-10-CM | POA: Diagnosis not present

## 2017-02-26 DIAGNOSIS — M25511 Pain in right shoulder: Secondary | ICD-10-CM | POA: Diagnosis not present

## 2017-02-26 DIAGNOSIS — R293 Abnormal posture: Secondary | ICD-10-CM | POA: Diagnosis not present

## 2017-02-26 NOTE — Therapy (Signed)
[place] MAIN [facility] SERVICES [address] [place], [place], [address] Phone: [phone]   Fax:  [phone]  Physical Therapy Treatment  Patient Details  Name: Alicia Alvarez MRN: [id number] Date of Birth: 1950-12-14 Referring Provider: Jill Alexanders MD  Encounter Date: 02/26/2017      PT End of Session - 02/26/17 1520    Visit Number 5   Number of Visits 10   Date for PT Re-Evaluation 03/26/17   Authorization - Visit Number 5   Authorization - Number of Visits 10   PT Start Time 1430   PT Stop Time 1515   PT Time Calculation (min) 45 min   Activity Tolerance Patient tolerated treatment well;Patient limited by pain   Behavior During Therapy [facility] for tasks assessed/performed      Past Medical History:  Diagnosis Date  . Ankle fracture    right - no surgery required  . 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 Hemorrhoids    external    Past Surgical History:  Procedure Laterality Date  . adnoidectomy     as child  . [place]   x 1  . DILATATION & CURETTAGE/HYSTEROSCOPY WITH MYOSURE N/A 10/10/2015   Procedure: DILATATION & CURETTAGE/HYSTEROSCOPY WITH MYOSURE;  Surgeon: Megan Salon, MD;  Location: [facility];  Service: Gynecology;  Laterality: N/A;  . DILATION AND CURETTAGE OF UTERUS     MAB in 1993 or 1994    There were no vitals filed for this visit.  Little big of pain in R deltoid with abduction and internal rotation in seated positioning ROM: WFL  MMT: R 4/5, L 5/5, discomfort with R  CPAs and UPAs of upper thoracic and cervical hypomobile, CPAs tender at C4-T3 to grade I-II.  Suboccipital release  parapsinals and R upper trap tight and tender to palpation  GH AP painful to grade I-II, PA nonpainful, inferior nonpainful AC : inferior and AP tender to grade II.  PROM cervical left side bending with PT overpressure at shoulder and head, PROM cervical left rotation with PT overpressure at shoulder  and head.  Lungtington's test=+ Long head of biceps tender, belly of biceps tender to palpation QuickDash:31.8%, QuickDash Work: 25%, QuickDash Sports 50%.   Intertubercular groove tender to palpation.  K tape Medial to lateral and over shoulder for proper positioning ( two pieces of tape, perpendicular)       Pt. response to medical necessity: Patient will continue to benefit from skilled physical therapy services for postural correction, strengthening, and mobility, and pain relief to allow for return to previous level of activity.               PT Education - 02/26/17 1520    Education provided Yes   Education Details long head of biceps anatomy, posture, icing schedule, stretches   Person(s) Educated Patient   Methods Explanation;Demonstration;Verbal cues;Tactile cues   Comprehension Verbalized understanding;Returned demonstration             PT Long Term Goals - 02/26/17 2104      PT LONG TERM GOAL #1   Title  Patient will decrease Quick DASH sports score by > 3 points demonstrating reduced self-reported upper extremity disability.   Baseline 6/29=0 (with quick dash and sports). Sports score: 5/30: 25% (8 pts)   Time 6   Period Weeks   Status Achieved     PT LONG TERM GOAL #2   Title Patient will  return to swimming a mile 3x/week for return to previous level of activity without an increase of pain   Baseline 6/29: return to swimming, no pain 5/30: unable to perform a mile at this time,   Time 6   Period Weeks   Status Achieved     PT LONG TERM GOAL #3   Title Patient will demonstrate adequate shoulder ROM and strength to be able to shave and dress independently with pain less than 2/10.   Baseline 6/29: no pain this session, able to dress independently. 5/30: pain can be up to 7/10 with IR   Time 6   Period Weeks   Status Achieved     PT LONG TERM GOAL #4   Title Patient will demonstrate proper postural corrections with min-no  cueing required to reduce FHRS and reduction of postural provoking symptoms    Baseline 6/29: proper postural corrections without cueing. 5/30 Pt. has severe FHRS posturing and requires Mod-max cueing for correction   Time 6   Period Weeks   Status Achieved     PT LONG TERM GOAL #5   Title Patient will tolerate Grade I-III AP and Inferior mobilizations to R [place] for improved shoulder mobility and reduction of pain.    Baseline 6/29: Pt. tolerates all grades of mobilizations   Time 6   Period Weeks   Status Achieved     Additional Long Term Goals   Additional Long Term Goals Yes     PT LONG TERM GOAL #6   Title Patient will decrease Quick DASH score by > 8 points (Symptom score: 23%, Work score 17%, Sport score 42%)  demonstrating reduced self-reported upper extremity disability.   Baseline 7/31: overall: 31%, work: 25%, sport 50%   Time 6   Period Weeks   Status New     PT LONG TERM GOAL #7   Title Patient will work a full day with no pain increase <2/10 to allow for improved quality of life.   Baseline Pain increases throughout day and makes work difficult   Time 6   Period Weeks   Status New     PT LONG TERM GOAL #8   Title Patient will open door and put on clothes/bra without pain to allow for return to previous level of function    Baseline Opening door and putting on clothes painful   Time 6   Period Weeks   Status New               Plan - 02/26/17 2102    Clinical Impression Statement Patient presents with irritation of long head of the biceps with R cervical and upper thoracic musculature tightness resulting in R shoulder pain and hypomobility. Pt. Had regression from more independent program and will return to skilled physical therapy interventions. QuickDash 31.8%, QuickDash work 25%, QuickDash sport 50%. Patient will benefit from skilled physical therapy for postural correction, strengthening and mobility and pain relief to allow for return to previous level of  activity    Rehab Potential Good   Clinical Impairments Affecting Rehab Potential chronicity   PT Frequency 2x / week   PT Duration 6 weeks   PT Treatment/Interventions ADLs/Self Care Home Management;Aquatic Therapy;Cryotherapy;Electrical Stimulation;Iontophoresis 4mg /ml Dexamethasone;Moist Heat;Traction;Ultrasound;Therapeutic activities;Therapeutic exercise;Neuromuscular re-education;Patient/family education;Manual techniques;Passive range of motion;Dry needling;Taping;Energy conservation   PT Next Visit Plan STM upper trap, stretch, bicep tendon ice massage   PT Home Exercise Plan see sheet   Consulted and Agree with Plan of Care Patient  Patient will benefit from skilled therapeutic intervention in order to improve the following deficits and impairments:  Decreased activity tolerance, Decreased endurance, Decreased strength, Hypomobility, Impaired UE functional use, Postural dysfunction, Improper body mechanics, Pain  Visit Diagnosis: Chronic right shoulder pain  Abnormal posture       G-Codes - 03/09/2017 11-06-36    Functional Assessment Tool Used (Outpatient Only) QuickDash, clinical impression, ROM, special tests, MMT   Functional Limitation Carrying, moving and handling objects   Carrying, Moving and Handling Objects Current Status [id number]) At least 20 percent but less than 40 percent impaired, limited or restricted   Carrying, Moving and Handling Objects Goal Status ([id number]) At least 1 percent but less than 20 percent impaired, limited or restricted      Problem List Patient Active Problem List   Diagnosis Date Noted  . Intramural leiomyoma of uterus 09/25/2015  Janna Arch, PT, DPT    Janna Arch Mar 09, 2017, 9:38 PM  [place] MAIN [facility] SERVICES [address] [place], [place], [address] Phone: [phone]   Fax:  [phone]  Name: Alicia Alvarez MRN: [id number] Date of Birth: 1950/12/18

## 2017-02-27 ENCOUNTER — Ambulatory Visit: Payer: PPO

## 2017-03-06 ENCOUNTER — Ambulatory Visit: Payer: PPO | Attending: Family Medicine

## 2017-03-06 DIAGNOSIS — M25511 Pain in right shoulder: Secondary | ICD-10-CM | POA: Diagnosis not present

## 2017-03-06 DIAGNOSIS — R293 Abnormal posture: Secondary | ICD-10-CM | POA: Diagnosis not present

## 2017-03-06 DIAGNOSIS — G8929 Other chronic pain: Secondary | ICD-10-CM | POA: Diagnosis not present

## 2017-03-07 MED FILL — TRETINOIN 0.05% CREAM: 0.05 | 30 days supply | Qty: 20 | Fill #0

## 2017-03-07 MED FILL — metroNIDAZOLE 0.75 % GEL: 0.75 | 20 days supply | Qty: 70 | Fill #0
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
## 2017-03-12 ENCOUNTER — Ambulatory Visit: Payer: PPO

## 2017-03-12 DIAGNOSIS — G8929 Other chronic pain: Secondary | ICD-10-CM

## 2017-03-12 DIAGNOSIS — R293 Abnormal posture: Secondary | ICD-10-CM

## 2017-03-12 DIAGNOSIS — M25511 Pain in right shoulder: Principal | ICD-10-CM
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
## 2017-03-15 ENCOUNTER — Telehealth: Payer: Self-pay | Admitting: Family Medicine

## 2017-03-15 NOTE — Telephone Encounter (Signed)
Pt has been going to PT since her last appointment in May. Currently goes to PT once a week. Shoulder pain has returned. Pt is not able to swim. Pain to not relieved by heat or cold. Should she come in for another injection, get referral to ortho, or continue PT?

## 2017-03-16 NOTE — Telephone Encounter (Signed)
I think it is time for her to see an orthopedic surgeon.

## 2017-03-18 ENCOUNTER — Other Ambulatory Visit: Payer: Self-pay

## 2017-03-18 DIAGNOSIS — M25511 Pain in right shoulder: Secondary | ICD-10-CM

## 2017-03-18 NOTE — Telephone Encounter (Signed)
Spoke with pt she wants to see peter whitfield sent referral to his office.

## 2017-03-20 ENCOUNTER — Telehealth: Payer: Self-pay | Admitting: Family Medicine

## 2017-03-20 NOTE — Telephone Encounter (Signed)
Have her schedule an appointment

## 2017-03-20 NOTE — Telephone Encounter (Signed)
Called and l/m for pt call us back to make an appt.

## 2017-03-20 NOTE — Telephone Encounter (Signed)
Pt states shoulder pain getting worse, impacting her sleep, made appt with Ortho for 04/29/17, can't get in any sooner.  Wants to know if you could give her another cortisone injection to hold her until appt

## 2017-03-21 ENCOUNTER — Ambulatory Visit (INDEPENDENT_AMBULATORY_CARE_PROVIDER_SITE_OTHER): Payer: PPO | Admitting: Family Medicine

## 2017-03-21 ENCOUNTER — Telehealth: Payer: Self-pay | Admitting: Family Medicine

## 2017-03-21 VITALS — BP 110/88 | HR 68 | Wt 147.6 lb

## 2017-03-21 DIAGNOSIS — M25511 Pain in right shoulder: Secondary | ICD-10-CM | POA: Diagnosis not present

## 2017-03-21 NOTE — Telephone Encounter (Signed)
Patient states that we are setting her up for MRI She wants to make sure we send her somewhere in her network and she can go before noon or anytime on Fridays

## 2017-03-21 NOTE — Telephone Encounter (Signed)
Pt coming in today

## 2017-03-21 NOTE — Telephone Encounter (Signed)
[facility] should be calling her for MRI.  I called them earlier.  Also faxed form to [facility] for MRI
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
## 2017-03-26 ENCOUNTER — Telehealth: Payer: Self-pay | Admitting: Obstetrics & Gynecology

## 2017-03-26 NOTE — Telephone Encounter (Signed)
Patient returning your call.

## 2017-03-26 NOTE — Telephone Encounter (Signed)
Returning a call to Jill . °

## 2017-03-26 NOTE — Telephone Encounter (Signed)
Patient wants to speak with the nurse no information given. °

## 2017-03-26 NOTE — Telephone Encounter (Signed)
Spoke with patient. Patient states is currently being treated for chronic BV with metrogel twice a week for 3 months. Patient states she has completed one month and has noticed no changes. Reports thick, light grey vaginal discharge; no odor, no itching.   Patient asking if medication should be changed or continue medication for the 3 months and retest?  Advised patient would update Dr. [name] and return call with recommendations, patient is agreeable.  Dr. [name]- please advise?

## 2017-03-26 NOTE — Telephone Encounter (Signed)
Left message to call Tyris Eliot at [phone].  

## 2017-03-27 NOTE — Telephone Encounter (Signed)
Spoke with patient, advised as seen below per Dr. [name]. Patient agreeable to plan and verbalizes understanding.  Patient is agreeable to disposition. Will close encounter.

## 2017-03-27 NOTE — Telephone Encounter (Signed)
She should use it for at least 3 months.  Sometimes this treatment is done for six months.  For now, will plan to retest at 3 months.  Thanks.

## 2017-03-29 DIAGNOSIS — D2272 Melanocytic nevi of left lower limb, including hip: Secondary | ICD-10-CM | POA: Diagnosis not present

## 2017-03-29 DIAGNOSIS — B078 Other viral warts: Secondary | ICD-10-CM | POA: Diagnosis not present

## 2017-03-29 DIAGNOSIS — D2271 Melanocytic nevi of right lower limb, including hip: Secondary | ICD-10-CM | POA: Diagnosis not present

## 2017-03-29 DIAGNOSIS — Z85828 Personal history of other malignant neoplasm of skin: Secondary | ICD-10-CM | POA: Diagnosis not present

## 2017-03-29 DIAGNOSIS — D2261 Melanocytic nevi of right upper limb, including shoulder: Secondary | ICD-10-CM | POA: Diagnosis not present

## 2017-03-29 DIAGNOSIS — L570 Actinic keratosis: Secondary | ICD-10-CM | POA: Diagnosis not present

## 2017-03-29 DIAGNOSIS — D225 Melanocytic nevi of trunk: Secondary | ICD-10-CM | POA: Diagnosis not present

## 2017-03-29 DIAGNOSIS — D1721 Benign lipomatous neoplasm of skin and subcutaneous tissue of right arm: Secondary | ICD-10-CM | POA: Diagnosis not present

## 2017-03-29 DIAGNOSIS — L821 Other seborrheic keratosis: Secondary | ICD-10-CM | POA: Diagnosis not present

## 2017-03-29 DIAGNOSIS — L814 Other melanin hyperpigmentation: Secondary | ICD-10-CM | POA: Diagnosis not present

## 2017-03-29 DIAGNOSIS — D2262 Melanocytic nevi of left upper limb, including shoulder: Secondary | ICD-10-CM | POA: Diagnosis not present

## 2017-04-05 ENCOUNTER — Telehealth: Payer: Self-pay | Admitting: Family Medicine

## 2017-04-05 MED ORDER — TRAMADOL HCL 50 MG PO TABS: 50.0000 mg | ORAL_TABLET | ORAL | 0 refills | Status: DC

## 2017-04-05 MED FILL — traMADol HCL 50 MG TABS: 50 | 5 days supply | Qty: 30 | Fill #0

## 2017-04-05 NOTE — Telephone Encounter (Signed)
Pt called and states that she is still having some shoulder pain when she was here she had denied getting pain medicine but she would like to get some now if possible, if you could send it in to the [place], [place]  Pt can be reached at [phone]

## 2017-04-05 NOTE — Telephone Encounter (Signed)
Called in tramadol.Pt notified.  Alicia Alvarez

## 2017-04-05 NOTE — Telephone Encounter (Signed)
Call in tramadol 50 mg #30. One every 4 hours when necessary pain

## 2017-04-10 ENCOUNTER — Other Ambulatory Visit: Payer: Self-pay

## 2017-04-11 MED FILL — metroNIDAZOLE 0.75 % GEL: 0.75 | 20 days supply | Qty: 70 | Fill #1

## 2017-04-11 MED FILL — PROGESTERONE 100 MG CAPSULE: 100 | 90 days supply | Qty: 90 | Fill #1

## 2017-04-29 ENCOUNTER — Ambulatory Visit (INDEPENDENT_AMBULATORY_CARE_PROVIDER_SITE_OTHER): Payer: PPO | Admitting: Orthopaedic Surgery

## 2017-04-29 ENCOUNTER — Encounter (INDEPENDENT_AMBULATORY_CARE_PROVIDER_SITE_OTHER): Payer: Self-pay | Admitting: Orthopaedic Surgery

## 2017-04-29 VITALS — BP 138/77 | HR 70 | Resp 12 | Ht 67.0 in | Wt 145.0 lb

## 2017-04-29 DIAGNOSIS — M25511 Pain in right shoulder: Secondary | ICD-10-CM | POA: Diagnosis not present

## 2017-04-29 DIAGNOSIS — G8929 Other chronic pain: Secondary | ICD-10-CM | POA: Diagnosis not present

## 2017-04-29 MED ORDER — DICLOFENAC SODIUM 1 % TD GEL: 4.0000 g | Freq: Three times a day (TID) | TRANSDERMAL | 4 refills | Status: DC | PRN

## 2017-04-29 MED FILL — DICLOFENAC SODIUM 1% GEL: 1 | 30 days supply | Qty: 300 | Fill #0
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
## 2017-05-17 ENCOUNTER — Ambulatory Visit: Payer: PPO | Admitting: Obstetrics & Gynecology

## 2017-05-21 ENCOUNTER — Telehealth: Payer: Self-pay | Admitting: Family Medicine

## 2017-05-21 ENCOUNTER — Telehealth: Payer: Self-pay | Admitting: Internal Medicine

## 2017-05-21 MED ORDER — ZOLPIDEM TARTRATE 5 MG PO TABS: 5.0000 mg | ORAL_TABLET | Freq: Every evening | ORAL | 1 refills | Status: DC | PRN

## 2017-05-21 MED FILL — ZOLPIDEM TARTRATE 5 MG TAB: 5 | 30 days supply | Qty: 30 | Fill #0

## 2017-05-21 NOTE — Telephone Encounter (Signed)
Pt called and states that she is having trouble sleeping cause of her pain from her shoulder and would like something for sleep sent to [facility]

## 2017-05-21 NOTE — Telephone Encounter (Signed)
Called pt to advise Ambien is called in to [facility].  No ans. Lm.

## 2017-05-21 NOTE — Telephone Encounter (Signed)
She is having difficulty with shoulder pain and sleeping. She did find Ambien help with that. I will give her enough to cover through getting an evaluation by Dr. Nelva Bush

## 2017-05-22 ENCOUNTER — Telehealth (INDEPENDENT_AMBULATORY_CARE_PROVIDER_SITE_OTHER): Payer: Self-pay | Admitting: Orthopedic Surgery

## 2017-05-22 NOTE — Telephone Encounter (Signed)
I called Alicia Alvarez.  Left a message on her voicemail to please return my call to discuss scheduling surgery.

## 2017-05-23 ENCOUNTER — Ambulatory Visit (INDEPENDENT_AMBULATORY_CARE_PROVIDER_SITE_OTHER): Payer: PPO | Admitting: Obstetrics & Gynecology

## 2017-05-23 ENCOUNTER — Telehealth: Payer: Self-pay | Admitting: Obstetrics & Gynecology

## 2017-05-23 VITALS — BP 96/6 | HR 74 | Resp 16 | Ht 67.0 in | Wt 150.0 lb

## 2017-05-23 DIAGNOSIS — N761 Subacute and chronic vaginitis: Secondary | ICD-10-CM

## 2017-05-23 MED ORDER — FLUCONAZOLE 150 MG PO TABS: 150.0000 mg | ORAL_TABLET | Freq: Once | ORAL | 0 refills | Status: AC

## 2017-05-23 NOTE — Telephone Encounter (Signed)
Spoke with patient. Advised of message as seen below from [place]. Rx for Diflucan 150 mg take po x 1 #1 0RF sent to pharmacy on file. Encounter closed.
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
## 2017-05-23 NOTE — Telephone Encounter (Signed)
Patient is asking to Dr.Miller's nurse. Patient was seen today and forgot to ask  Dr.Miller for diflucan. Patient would like to talk with a nurse.

## 2017-05-23 NOTE — Telephone Encounter (Signed)
It's ok to take a dose of Diflucan.  Ok to call in for pt.

## 2017-05-23 NOTE — Telephone Encounter (Signed)
Spoke with patient. Patient is asking if Dr.Miller tested for BV and yeast today or only BV. Advised test is for both BV and yeast. Patient is asking if Dr.Miller feels she could take a dose of Diflucan before results return and if this could be sent in or if she needs to await results. Advised will review with Dr.Miller and return call.

## 2017-05-26 ENCOUNTER — Encounter: Payer: Self-pay | Admitting: Obstetrics & Gynecology

## 2017-05-27 LAB — NUSWAB BV AND CANDIDA, NAA
Candida albicans, NAA: NEGATIVE
Candida glabrata, NAA: NEGATIVE

## 2017-05-29 DIAGNOSIS — G8929 Other chronic pain: Secondary | ICD-10-CM | POA: Diagnosis not present

## 2017-05-29 DIAGNOSIS — M542 Cervicalgia: Secondary | ICD-10-CM | POA: Diagnosis not present

## 2017-05-29 DIAGNOSIS — M25511 Pain in right shoulder: Secondary | ICD-10-CM | POA: Diagnosis not present

## 2017-05-31 ENCOUNTER — Telehealth: Payer: Self-pay | Admitting: Obstetrics & Gynecology

## 2017-05-31 NOTE — Telephone Encounter (Signed)
Spoke with patient results and message given to patient as seen below from [place]. Patient verbalizes understanding. Would like to monitor and contact the office with future symptoms.  Written by Megan Salon, MD on 05/29/2017 10:06 AM  Ms. Mainwaring,  Your swab test was negative for both yeast and BV. The BV portion of the test did show some bacteria but not in a high enough level to be consistent with BV. This bacteria is typically part of the vaginal bacteria, so seeing some is common. As this is now negative, I would recommend stopping the Metrogel and then rechecking again with new symptoms or in another 2-3 months. What do you think?   Edwinna Areola    Routing to provider for final review. Patient agreeable to disposition. Will close encounter.

## 2017-05-31 NOTE — Telephone Encounter (Signed)
Patient called to check on the status of her recent labs.

## 2017-06-24 ENCOUNTER — Telehealth (INDEPENDENT_AMBULATORY_CARE_PROVIDER_SITE_OTHER): Payer: Self-pay | Admitting: Orthopaedic Surgery

## 2017-06-24 NOTE — Telephone Encounter (Signed)
Per patient, not ready to schedule surgery. Please dc surgery papers. Patient will call if she decides to have surgery in the future. (I advised Malachy Mood of this also.)

## 2017-06-25 NOTE — Telephone Encounter (Signed)
done

## 2017-07-01 ENCOUNTER — Other Ambulatory Visit: Payer: Self-pay | Admitting: Obstetrics & Gynecology

## 2017-07-01 DIAGNOSIS — N951 Menopausal and female climacteric states: Secondary | ICD-10-CM

## 2017-07-01 NOTE — Telephone Encounter (Signed)
Medication refill request: Progesterone Last AEX:  03/22/16 SM Last OV: 05/23/17 Next AEX: none scheduled Last MMG (if hormonal medication request): 08/06/16 BIRADS 2 benign/density c Refill authorized: 01/11/17 #90 w/1 refill; today please advise

## 2017-07-03 MED FILL — ESTRADIOL PATCH 0.0375: 0.0375 | 28 days supply | Qty: 8 | Fill #0

## 2017-07-03 MED FILL — PROGESTERONE 100 MG CAPSULE: 100 | 90 days supply | Qty: 90 | Fill #0

## 2017-07-05 MED FILL — ZOLPIDEM TARTRATE 5 MG TAB: 5 | 30 days supply | Qty: 30 | Fill #1

## 2017-08-07 DIAGNOSIS — L299 Pruritus, unspecified: Secondary | ICD-10-CM | POA: Diagnosis not present

## 2017-08-27 MED FILL — ESTRADIOL PATCH 0.0375: 0.0375 | 28 days supply | Qty: 8 | Fill #1

## 2017-09-13 DIAGNOSIS — L821 Other seborrheic keratosis: Secondary | ICD-10-CM | POA: Diagnosis not present

## 2017-09-13 DIAGNOSIS — L570 Actinic keratosis: Secondary | ICD-10-CM | POA: Diagnosis not present

## 2017-09-13 DIAGNOSIS — L820 Inflamed seborrheic keratosis: Secondary | ICD-10-CM | POA: Diagnosis not present

## 2017-09-13 DIAGNOSIS — Z85828 Personal history of other malignant neoplasm of skin: Secondary | ICD-10-CM | POA: Diagnosis not present

## 2017-09-27 DIAGNOSIS — Z85828 Personal history of other malignant neoplasm of skin: Secondary | ICD-10-CM | POA: Diagnosis not present

## 2017-09-27 DIAGNOSIS — B078 Other viral warts: Secondary | ICD-10-CM | POA: Diagnosis not present

## 2017-09-27 DIAGNOSIS — L570 Actinic keratosis: Secondary | ICD-10-CM | POA: Diagnosis not present

## 2017-09-27 DIAGNOSIS — L820 Inflamed seborrheic keratosis: Secondary | ICD-10-CM | POA: Diagnosis not present

## 2017-10-11 ENCOUNTER — Other Ambulatory Visit: Payer: Self-pay | Admitting: Family Medicine

## 2017-10-11 MED FILL — ESTRADIOL PATCH 0.0375: 0.0375 | 28 days supply | Qty: 8 | Fill #2

## 2017-10-11 MED FILL — ZOLPIDEM TARTRATE 5 MG TAB: 5 | 30 days supply | Qty: 30 | Fill #0

## 2017-10-11 MED FILL — PROGESTERONE 100 MG CAPSULE: 100 | 90 days supply | Qty: 90 | Fill #1

## 2017-10-11 NOTE — Telephone Encounter (Signed)
Please advise if Lorrin Mais can be filled at [facility]. [place]

## 2017-11-18 ENCOUNTER — Telehealth: Payer: Self-pay | Admitting: Family Medicine

## 2017-11-18 NOTE — Telephone Encounter (Signed)
ok 

## 2017-11-18 NOTE — Telephone Encounter (Signed)
Pt called and stated that she is receiving prolo therapy from Dr. Gust Rung and he states she will benefit from physical therapy. She called [facility] to set something up and they informed her that pcp would need to request. Please send referral to [facility] and advise pt at [phone].

## 2017-11-19 ENCOUNTER — Telehealth: Payer: Self-pay

## 2017-11-19 NOTE — Telephone Encounter (Signed)
Called pt to let her know to be on the look out for a phone call from [facility] for an appt. Referral was placed on her behalf. [facility] 11-19-17

## 2017-11-19 NOTE — Telephone Encounter (Signed)
Pt was called to let her know that we have place referral or her to go to [facility]. They wil contact pt to schedule . I will fax over pt insurance card. [facility] 11-19-17

## 2017-11-21 DIAGNOSIS — B078 Other viral warts: Secondary | ICD-10-CM | POA: Diagnosis not present

## 2017-11-21 DIAGNOSIS — Z85828 Personal history of other malignant neoplasm of skin: Secondary | ICD-10-CM | POA: Diagnosis not present

## 2017-11-21 DIAGNOSIS — L239 Allergic contact dermatitis, unspecified cause: Secondary | ICD-10-CM | POA: Diagnosis not present

## 2017-11-21 DIAGNOSIS — L820 Inflamed seborrheic keratosis: Secondary | ICD-10-CM | POA: Diagnosis not present

## 2017-11-21 MED FILL — BETAMETHASONE DP 0.05% OINT: 0.05 | 14 days supply | Qty: 30 | Fill #0

## 2017-12-05 DIAGNOSIS — M546 Pain in thoracic spine: Secondary | ICD-10-CM | POA: Diagnosis not present

## 2017-12-05 DIAGNOSIS — M542 Cervicalgia: Secondary | ICD-10-CM | POA: Diagnosis not present

## 2017-12-05 DIAGNOSIS — M25511 Pain in right shoulder: Secondary | ICD-10-CM | POA: Diagnosis not present

## 2017-12-16 DIAGNOSIS — M546 Pain in thoracic spine: Secondary | ICD-10-CM | POA: Diagnosis not present

## 2017-12-16 DIAGNOSIS — M542 Cervicalgia: Secondary | ICD-10-CM | POA: Diagnosis not present

## 2017-12-16 DIAGNOSIS — M25511 Pain in right shoulder: Secondary | ICD-10-CM | POA: Diagnosis not present

## 2017-12-24 DIAGNOSIS — M542 Cervicalgia: Secondary | ICD-10-CM | POA: Diagnosis not present

## 2017-12-24 DIAGNOSIS — M546 Pain in thoracic spine: Secondary | ICD-10-CM | POA: Diagnosis not present

## 2017-12-24 DIAGNOSIS — M25511 Pain in right shoulder: Secondary | ICD-10-CM | POA: Diagnosis not present

## 2017-12-30 DIAGNOSIS — M546 Pain in thoracic spine: Secondary | ICD-10-CM | POA: Diagnosis not present

## 2017-12-30 DIAGNOSIS — M542 Cervicalgia: Secondary | ICD-10-CM | POA: Diagnosis not present

## 2017-12-30 DIAGNOSIS — M25511 Pain in right shoulder: Secondary | ICD-10-CM | POA: Diagnosis not present

## 2018-01-10 DIAGNOSIS — M546 Pain in thoracic spine: Secondary | ICD-10-CM | POA: Diagnosis not present

## 2018-01-10 DIAGNOSIS — M542 Cervicalgia: Secondary | ICD-10-CM | POA: Diagnosis not present

## 2018-01-10 DIAGNOSIS — M25511 Pain in right shoulder: Secondary | ICD-10-CM | POA: Diagnosis not present

## 2018-01-13 ENCOUNTER — Other Ambulatory Visit: Payer: Self-pay | Admitting: Obstetrics & Gynecology

## 2018-01-13 DIAGNOSIS — N951 Menopausal and female climacteric states: Secondary | ICD-10-CM

## 2018-01-13 IMAGING — MR MR SHOULDER*R* W/O CM
4 of 5 series · 30 of 40 positions shown · non-contrast
Comparison: None.

CLINICAL DATA: Right shoulder pain and weakness with limited range
of motion for 1 year.

EXAM:
MRI OF THE RIGHT SHOULDER WITHOUT CONTRAST
TECHNIQUE: Multiplanar, multisequence MR imaging of the shoulder was performed.
No intravenous contrast was administered.

[Series 3: T2 fat-sat · axial · 4.0mm · 0.25mm/px · z∈[-75,+11]mm · 8 of 20 slices shown (1 of 3)]
[im 1/20]
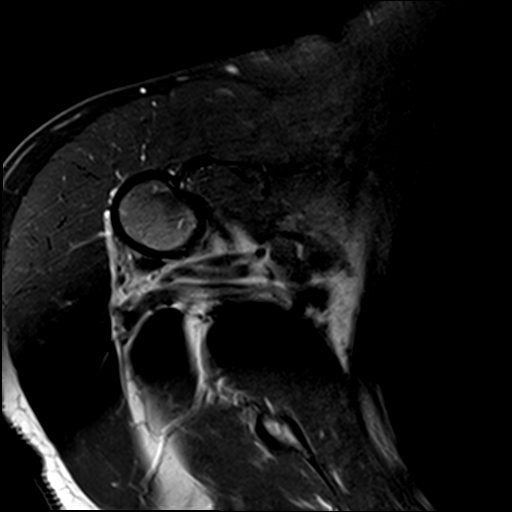
[im 3/20]
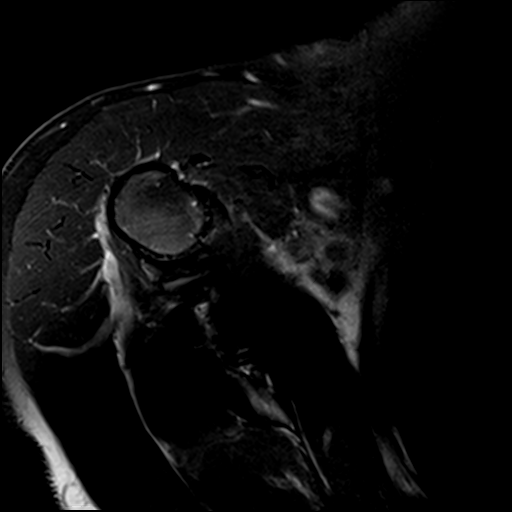
[im 6/20]
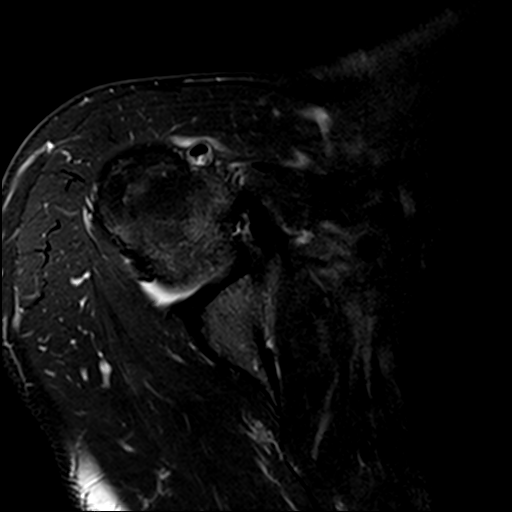
[im 9/20]
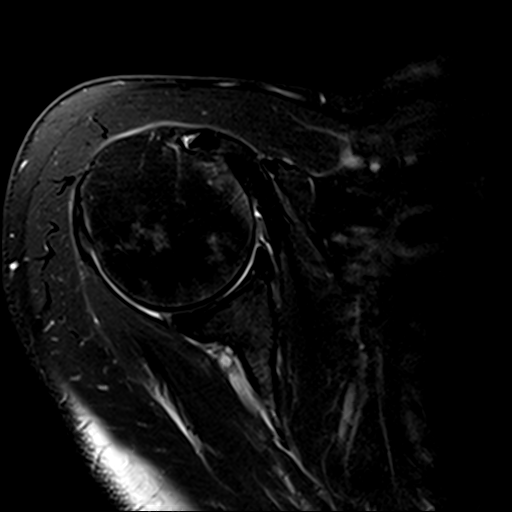
[im 11/20]
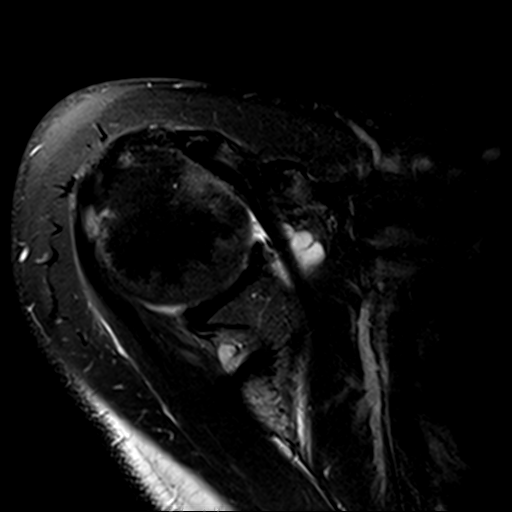
[im 14/20]
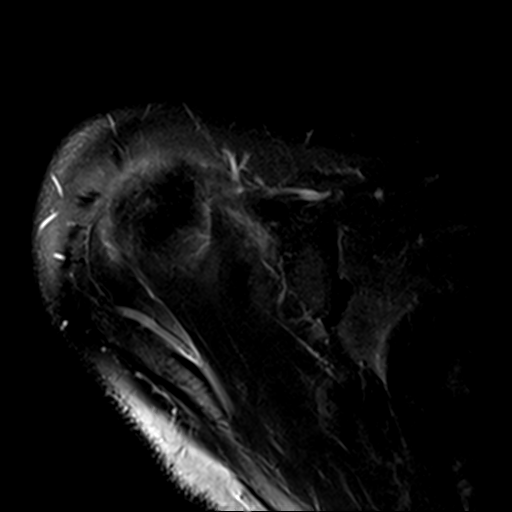
[im 17/20]
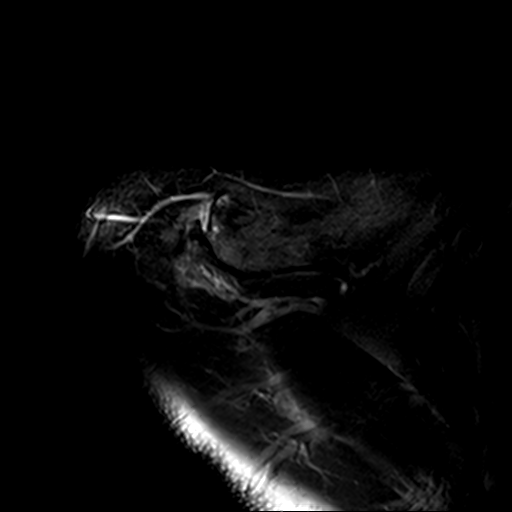
[im 20/20]
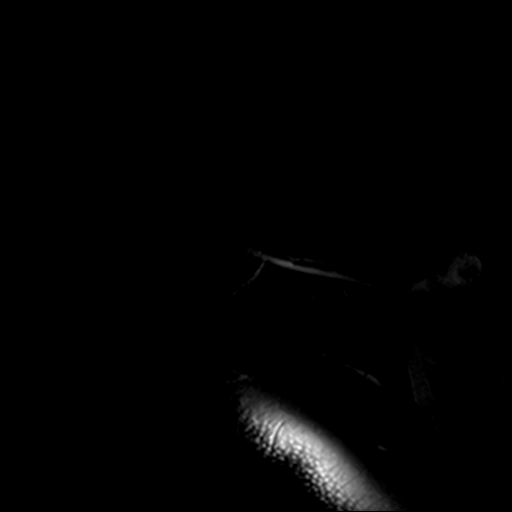

[Series 4: T2 fat-sat · coronal · 4.0mm · 0.55mm/px · 9 of 20 slices shown (2 of 3)]
[im 1/20]
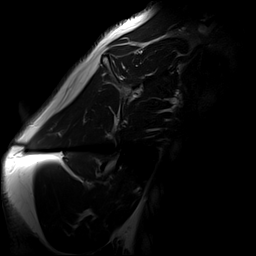
[im 3/20]
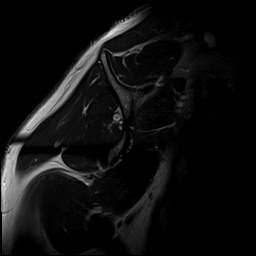
[im 5/20]
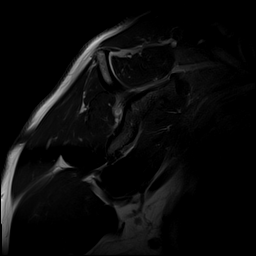
[im 8/20]
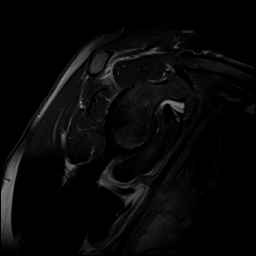
[im 10/20]
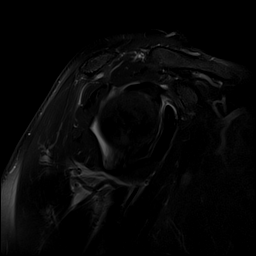
[im 12/20]
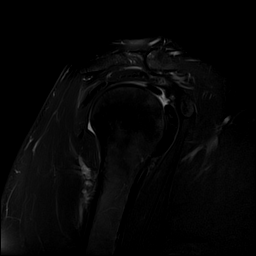
[im 15/20]
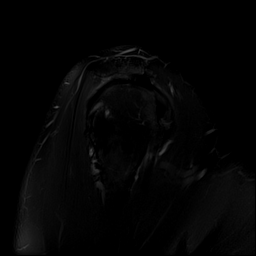
[im 17/20]
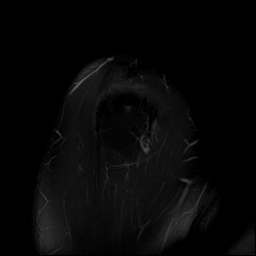
[im 20/20]
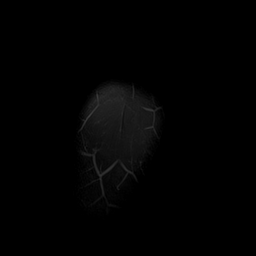

[Series 6: T2 fat-sat · sagittal · 4.0mm · 0.55mm/px · 6 of 15 slices shown (3 of 3)]
[im 1/15]
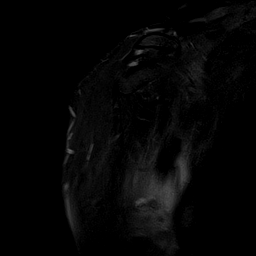
[im 3/15]
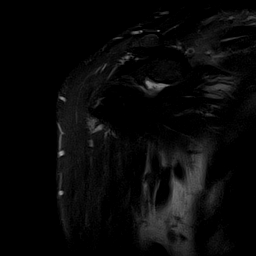
[im 5/15]
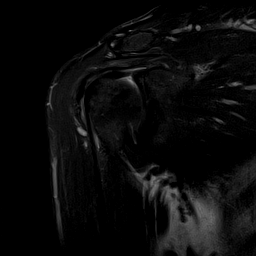
[im 8/15]
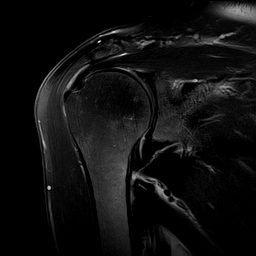
[im 10/15]
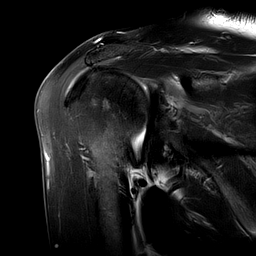
[im 12/15]
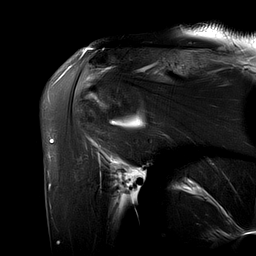

[Series 7: PD · sagittal · 4.0mm · 0.27mm/px · 7 of 15 slices shown]
[im 1/15]
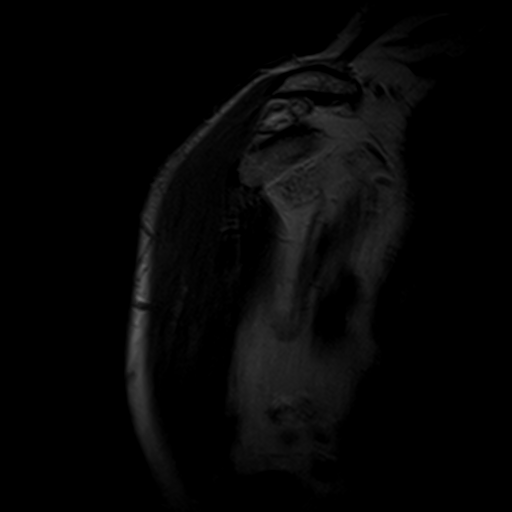
[im 3/15]
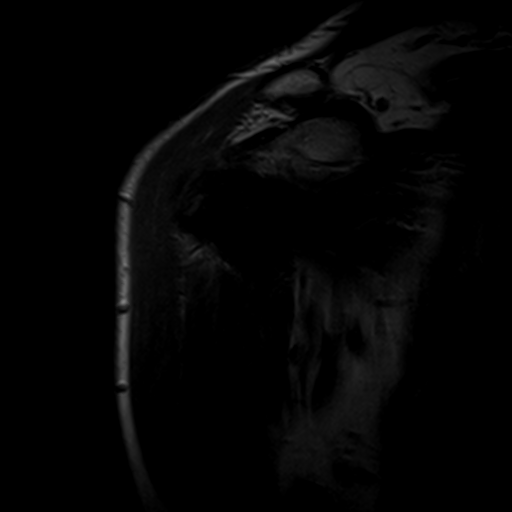
[im 5/15]
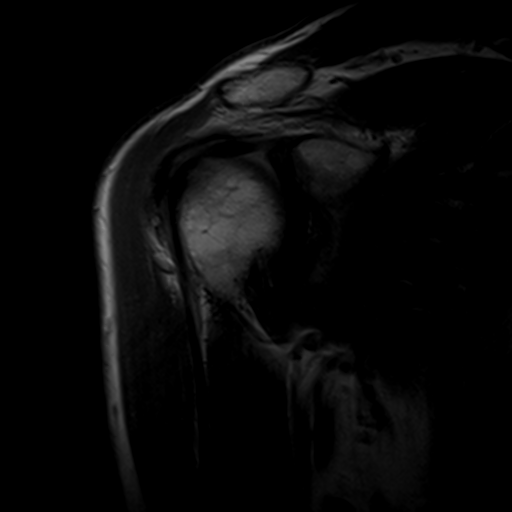
[im 8/15]
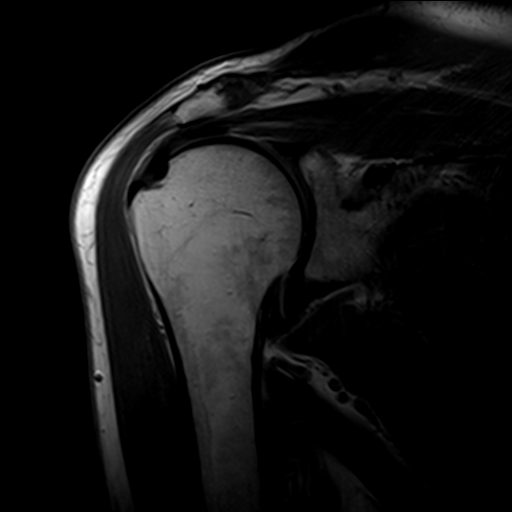
[im 10/15]
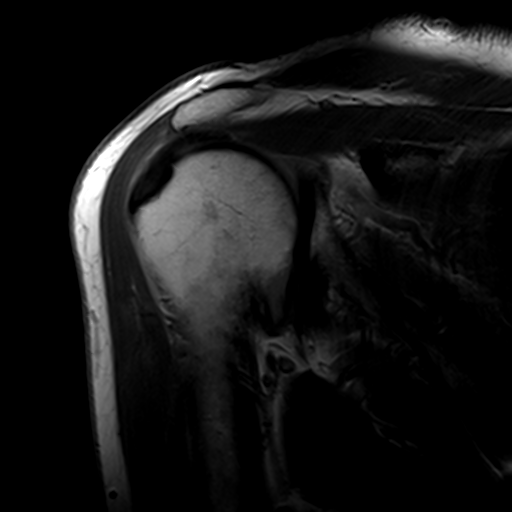
[im 12/15]
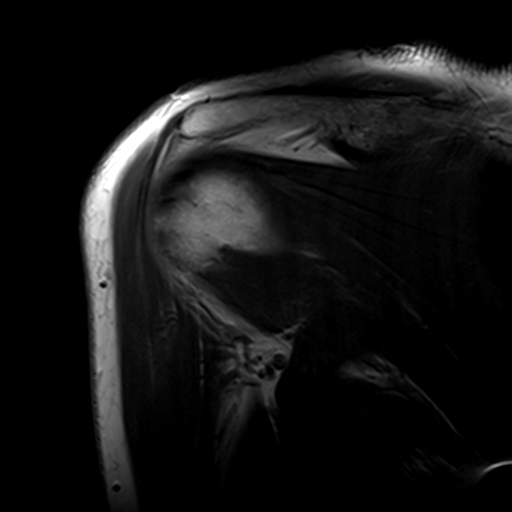
[im 15/15]
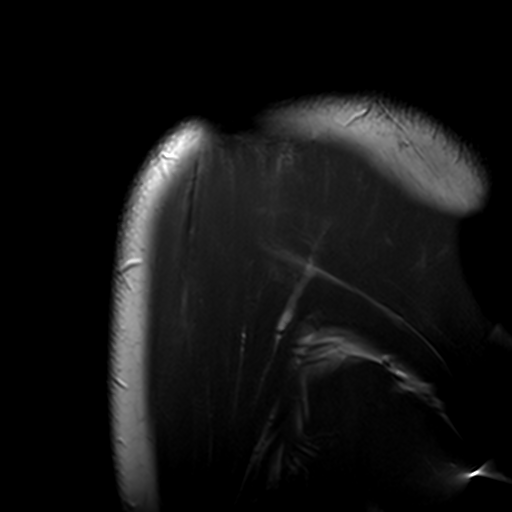

[30 of 40 positions shown; findings below may reference images not displayed]

FINDINGS: Rotator cuff: Focal tendinopathy of the distal subscapularis and
infraspinatus tendons. The rotator cuff is otherwise normal.

Muscles:  Normal.

Biceps long head:  Properly located and intact.

Acromioclavicular Joint: Minimal degenerative changes. Normal type 2
acromion. Slight inflammation of the subacromial/subdeltoid bursa.

Glenohumeral Joint: Normal.

Labrum:  Normal.

Bones:  No significant abnormalities.

Other: None
IMPRESSION: Minimal degenerative changes of the infraspinatus and subscapularis
tendons. Minimal inflammation of the subacromial/subdeltoid bursa.

## 2018-01-13 MED FILL — PROGESTERONE 100 MG CAPSULE: 100 | 90 days supply | Qty: 90 | Fill #2

## 2018-01-13 NOTE — Telephone Encounter (Signed)
Medication refill request: Vivelle Patch  Last AEX:  03-22-16  Last OV: 05-23-17  Next AEX: not yet scheduled  Last MMG (if hormonal medication request): 08-06-16 WNL  Refill authorized: please advise

## 2018-01-14 MED FILL — ESTRADIOL PATCH 0.0375: 0.0375 | 84 days supply | Qty: 24 | Fill #0

## 2018-01-14 MED FILL — valACYclovir HCL 1 GM TABS: 1 | 3 days supply | Qty: 12 | Fill #0

## 2018-01-15 DIAGNOSIS — M542 Cervicalgia: Secondary | ICD-10-CM | POA: Diagnosis not present

## 2018-01-15 DIAGNOSIS — M25511 Pain in right shoulder: Secondary | ICD-10-CM | POA: Diagnosis not present

## 2018-01-15 DIAGNOSIS — M546 Pain in thoracic spine: Secondary | ICD-10-CM | POA: Diagnosis not present

## 2018-01-29 DIAGNOSIS — M25511 Pain in right shoulder: Secondary | ICD-10-CM | POA: Diagnosis not present

## 2018-01-29 DIAGNOSIS — M542 Cervicalgia: Secondary | ICD-10-CM | POA: Diagnosis not present

## 2018-01-29 DIAGNOSIS — M546 Pain in thoracic spine: Secondary | ICD-10-CM | POA: Diagnosis not present

## 2018-02-04 DIAGNOSIS — M542 Cervicalgia: Secondary | ICD-10-CM | POA: Diagnosis not present

## 2018-02-04 DIAGNOSIS — M546 Pain in thoracic spine: Secondary | ICD-10-CM | POA: Diagnosis not present

## 2018-02-04 DIAGNOSIS — M25511 Pain in right shoulder: Secondary | ICD-10-CM | POA: Diagnosis not present

## 2018-02-07 DIAGNOSIS — M546 Pain in thoracic spine: Secondary | ICD-10-CM | POA: Diagnosis not present

## 2018-02-07 DIAGNOSIS — M25511 Pain in right shoulder: Secondary | ICD-10-CM | POA: Diagnosis not present

## 2018-02-07 DIAGNOSIS — M542 Cervicalgia: Secondary | ICD-10-CM | POA: Diagnosis not present

## 2018-02-14 DIAGNOSIS — B078 Other viral warts: Secondary | ICD-10-CM | POA: Diagnosis not present

## 2018-02-14 DIAGNOSIS — Z85828 Personal history of other malignant neoplasm of skin: Secondary | ICD-10-CM | POA: Diagnosis not present

## 2018-02-14 DIAGNOSIS — L309 Dermatitis, unspecified: Secondary | ICD-10-CM | POA: Diagnosis not present

## 2018-02-14 DIAGNOSIS — L905 Scar conditions and fibrosis of skin: Secondary | ICD-10-CM | POA: Diagnosis not present

## 2018-02-14 DIAGNOSIS — L570 Actinic keratosis: Secondary | ICD-10-CM | POA: Diagnosis not present

## 2018-02-14 MED FILL — IMIQUIMOD 5% CREAM PACKET: 5 | 28 days supply | Qty: 12 | Fill #0

## 2018-02-19 DIAGNOSIS — M546 Pain in thoracic spine: Secondary | ICD-10-CM | POA: Diagnosis not present

## 2018-02-19 DIAGNOSIS — M25511 Pain in right shoulder: Secondary | ICD-10-CM | POA: Diagnosis not present

## 2018-02-19 DIAGNOSIS — M542 Cervicalgia: Secondary | ICD-10-CM | POA: Diagnosis not present

## 2018-02-28 DIAGNOSIS — M25511 Pain in right shoulder: Secondary | ICD-10-CM | POA: Diagnosis not present

## 2018-02-28 DIAGNOSIS — M542 Cervicalgia: Secondary | ICD-10-CM | POA: Diagnosis not present

## 2018-02-28 DIAGNOSIS — M546 Pain in thoracic spine: Secondary | ICD-10-CM | POA: Diagnosis not present

## 2018-03-03 DIAGNOSIS — M25511 Pain in right shoulder: Secondary | ICD-10-CM | POA: Diagnosis not present

## 2018-03-03 DIAGNOSIS — M546 Pain in thoracic spine: Secondary | ICD-10-CM | POA: Diagnosis not present

## 2018-03-03 DIAGNOSIS — M542 Cervicalgia: Secondary | ICD-10-CM | POA: Diagnosis not present

## 2018-03-07 DIAGNOSIS — M546 Pain in thoracic spine: Secondary | ICD-10-CM | POA: Diagnosis not present

## 2018-03-07 DIAGNOSIS — M25511 Pain in right shoulder: Secondary | ICD-10-CM | POA: Diagnosis not present

## 2018-03-07 DIAGNOSIS — M542 Cervicalgia: Secondary | ICD-10-CM | POA: Diagnosis not present

## 2018-03-10 DIAGNOSIS — M546 Pain in thoracic spine: Secondary | ICD-10-CM | POA: Diagnosis not present

## 2018-03-10 DIAGNOSIS — M542 Cervicalgia: Secondary | ICD-10-CM | POA: Diagnosis not present

## 2018-03-10 DIAGNOSIS — M25511 Pain in right shoulder: Secondary | ICD-10-CM | POA: Diagnosis not present

## 2018-03-13 DIAGNOSIS — M542 Cervicalgia: Secondary | ICD-10-CM | POA: Diagnosis not present

## 2018-03-13 DIAGNOSIS — M25511 Pain in right shoulder: Secondary | ICD-10-CM | POA: Diagnosis not present

## 2018-03-13 DIAGNOSIS — M546 Pain in thoracic spine: Secondary | ICD-10-CM | POA: Diagnosis not present

## 2018-03-17 DIAGNOSIS — M546 Pain in thoracic spine: Secondary | ICD-10-CM | POA: Diagnosis not present

## 2018-03-17 DIAGNOSIS — M25511 Pain in right shoulder: Secondary | ICD-10-CM | POA: Diagnosis not present

## 2018-03-17 DIAGNOSIS — M542 Cervicalgia: Secondary | ICD-10-CM | POA: Diagnosis not present

## 2018-03-20 MED FILL — ZOLPIDEM TARTRATE 5 MG TAB: 5 | 30 days supply | Qty: 30 | Fill #1

## 2018-03-21 DIAGNOSIS — M25511 Pain in right shoulder: Secondary | ICD-10-CM | POA: Diagnosis not present

## 2018-03-21 DIAGNOSIS — M546 Pain in thoracic spine: Secondary | ICD-10-CM | POA: Diagnosis not present

## 2018-03-21 DIAGNOSIS — M542 Cervicalgia: Secondary | ICD-10-CM | POA: Diagnosis not present

## 2018-03-24 DIAGNOSIS — M25511 Pain in right shoulder: Secondary | ICD-10-CM | POA: Diagnosis not present

## 2018-03-24 DIAGNOSIS — M542 Cervicalgia: Secondary | ICD-10-CM | POA: Diagnosis not present

## 2018-03-24 DIAGNOSIS — M546 Pain in thoracic spine: Secondary | ICD-10-CM | POA: Diagnosis not present

## 2018-03-27 DIAGNOSIS — M546 Pain in thoracic spine: Secondary | ICD-10-CM | POA: Diagnosis not present

## 2018-03-27 DIAGNOSIS — M25511 Pain in right shoulder: Secondary | ICD-10-CM | POA: Diagnosis not present

## 2018-03-27 DIAGNOSIS — M542 Cervicalgia: Secondary | ICD-10-CM | POA: Diagnosis not present

## 2018-04-02 DIAGNOSIS — M546 Pain in thoracic spine: Secondary | ICD-10-CM | POA: Diagnosis not present

## 2018-04-02 DIAGNOSIS — M25511 Pain in right shoulder: Secondary | ICD-10-CM | POA: Diagnosis not present

## 2018-04-02 DIAGNOSIS — M542 Cervicalgia: Secondary | ICD-10-CM | POA: Diagnosis not present

## 2018-04-08 DIAGNOSIS — M25511 Pain in right shoulder: Secondary | ICD-10-CM | POA: Diagnosis not present

## 2018-04-08 DIAGNOSIS — M542 Cervicalgia: Secondary | ICD-10-CM | POA: Diagnosis not present

## 2018-04-08 DIAGNOSIS — M546 Pain in thoracic spine: Secondary | ICD-10-CM | POA: Diagnosis not present

## 2018-04-18 DIAGNOSIS — M542 Cervicalgia: Secondary | ICD-10-CM | POA: Diagnosis not present

## 2018-04-18 DIAGNOSIS — M25511 Pain in right shoulder: Secondary | ICD-10-CM | POA: Diagnosis not present

## 2018-04-18 DIAGNOSIS — M546 Pain in thoracic spine: Secondary | ICD-10-CM | POA: Diagnosis not present

## 2018-04-22 DIAGNOSIS — M546 Pain in thoracic spine: Secondary | ICD-10-CM | POA: Diagnosis not present

## 2018-04-22 DIAGNOSIS — M25511 Pain in right shoulder: Secondary | ICD-10-CM | POA: Diagnosis not present

## 2018-04-22 DIAGNOSIS — M542 Cervicalgia: Secondary | ICD-10-CM | POA: Diagnosis not present

## 2018-04-29 DIAGNOSIS — M546 Pain in thoracic spine: Secondary | ICD-10-CM | POA: Diagnosis not present

## 2018-04-29 DIAGNOSIS — M25511 Pain in right shoulder: Secondary | ICD-10-CM | POA: Diagnosis not present

## 2018-04-29 DIAGNOSIS — M542 Cervicalgia: Secondary | ICD-10-CM | POA: Diagnosis not present

## 2018-05-05 MED FILL — PROGESTERONE 100 MG CAPSULE: 100 | 90 days supply | Qty: 90 | Fill #3

## 2018-05-06 DIAGNOSIS — M546 Pain in thoracic spine: Secondary | ICD-10-CM | POA: Diagnosis not present

## 2018-05-06 DIAGNOSIS — M542 Cervicalgia: Secondary | ICD-10-CM | POA: Diagnosis not present

## 2018-05-06 DIAGNOSIS — M25511 Pain in right shoulder: Secondary | ICD-10-CM | POA: Diagnosis not present

## 2018-05-12 DIAGNOSIS — M542 Cervicalgia: Secondary | ICD-10-CM | POA: Diagnosis not present

## 2018-05-12 DIAGNOSIS — M25511 Pain in right shoulder: Secondary | ICD-10-CM | POA: Diagnosis not present

## 2018-05-12 DIAGNOSIS — M546 Pain in thoracic spine: Secondary | ICD-10-CM | POA: Diagnosis not present

## 2018-05-20 DIAGNOSIS — M546 Pain in thoracic spine: Secondary | ICD-10-CM | POA: Diagnosis not present

## 2018-05-20 DIAGNOSIS — M542 Cervicalgia: Secondary | ICD-10-CM | POA: Diagnosis not present

## 2018-05-20 DIAGNOSIS — M25511 Pain in right shoulder: Secondary | ICD-10-CM | POA: Diagnosis not present

## 2018-05-28 DIAGNOSIS — M546 Pain in thoracic spine: Secondary | ICD-10-CM | POA: Diagnosis not present

## 2018-05-28 DIAGNOSIS — M25511 Pain in right shoulder: Secondary | ICD-10-CM | POA: Diagnosis not present

## 2018-05-28 DIAGNOSIS — M542 Cervicalgia: Secondary | ICD-10-CM | POA: Diagnosis not present

## 2018-06-05 DIAGNOSIS — M25511 Pain in right shoulder: Secondary | ICD-10-CM | POA: Diagnosis not present

## 2018-06-05 DIAGNOSIS — M546 Pain in thoracic spine: Secondary | ICD-10-CM | POA: Diagnosis not present

## 2018-06-05 DIAGNOSIS — M542 Cervicalgia: Secondary | ICD-10-CM | POA: Diagnosis not present

## 2018-06-10 DIAGNOSIS — M25511 Pain in right shoulder: Secondary | ICD-10-CM | POA: Diagnosis not present

## 2018-06-10 DIAGNOSIS — M546 Pain in thoracic spine: Secondary | ICD-10-CM | POA: Diagnosis not present

## 2018-06-10 DIAGNOSIS — M542 Cervicalgia: Secondary | ICD-10-CM | POA: Diagnosis not present

## 2018-06-19 DIAGNOSIS — M542 Cervicalgia: Secondary | ICD-10-CM | POA: Diagnosis not present

## 2018-06-19 DIAGNOSIS — M25511 Pain in right shoulder: Secondary | ICD-10-CM | POA: Diagnosis not present

## 2018-06-19 DIAGNOSIS — M546 Pain in thoracic spine: Secondary | ICD-10-CM | POA: Diagnosis not present

## 2018-06-25 DIAGNOSIS — M542 Cervicalgia: Secondary | ICD-10-CM | POA: Diagnosis not present

## 2018-06-25 DIAGNOSIS — M25511 Pain in right shoulder: Secondary | ICD-10-CM | POA: Diagnosis not present

## 2018-06-25 DIAGNOSIS — M546 Pain in thoracic spine: Secondary | ICD-10-CM | POA: Diagnosis not present

## 2018-07-07 ENCOUNTER — Other Ambulatory Visit: Payer: Self-pay | Admitting: Obstetrics & Gynecology

## 2018-07-07 DIAGNOSIS — N951 Menopausal and female climacteric states: Secondary | ICD-10-CM

## 2018-07-07 NOTE — Telephone Encounter (Signed)
Medication refill request: Progesterone Last AEX:  03/22/16 SM Next OV: 05/23/17 Chronic Vaginitis Last MMG (if hormonal medication request): not done per [facility] Refill authorized: 07/03/17 #90 w/3 refills; today please advise

## 2018-07-07 NOTE — Telephone Encounter (Signed)
Please let pt know her prometrium 100mg  was RF for 30 days.  I do not have an up to date mammogram for her and we checked with [facility] and the last one was done 1/18.  I do need her to update her mammogram so I can do additional refills for her.  Thanks.

## 2018-07-11 DIAGNOSIS — M542 Cervicalgia: Secondary | ICD-10-CM | POA: Diagnosis not present

## 2018-07-11 DIAGNOSIS — M546 Pain in thoracic spine: Secondary | ICD-10-CM | POA: Diagnosis not present

## 2018-07-11 DIAGNOSIS — M25511 Pain in right shoulder: Secondary | ICD-10-CM | POA: Diagnosis not present

## 2018-07-17 DIAGNOSIS — M542 Cervicalgia: Secondary | ICD-10-CM | POA: Diagnosis not present

## 2018-07-17 DIAGNOSIS — M25511 Pain in right shoulder: Secondary | ICD-10-CM | POA: Diagnosis not present

## 2018-07-17 DIAGNOSIS — M546 Pain in thoracic spine: Secondary | ICD-10-CM | POA: Diagnosis not present

## 2018-07-18 MED FILL — ESTRADIOL PATCH 0.0375: 0.0375 | 84 days supply | Qty: 24 | Fill #1

## 2018-07-31 DIAGNOSIS — M542 Cervicalgia: Secondary | ICD-10-CM | POA: Diagnosis not present

## 2018-07-31 DIAGNOSIS — M25511 Pain in right shoulder: Secondary | ICD-10-CM | POA: Diagnosis not present

## 2018-07-31 DIAGNOSIS — M546 Pain in thoracic spine: Secondary | ICD-10-CM | POA: Diagnosis not present

## 2018-08-06 DIAGNOSIS — M25511 Pain in right shoulder: Secondary | ICD-10-CM | POA: Diagnosis not present

## 2018-08-06 DIAGNOSIS — M546 Pain in thoracic spine: Secondary | ICD-10-CM | POA: Diagnosis not present

## 2018-08-06 DIAGNOSIS — M542 Cervicalgia: Secondary | ICD-10-CM | POA: Diagnosis not present

## 2018-08-07 ENCOUNTER — Telehealth (INDEPENDENT_AMBULATORY_CARE_PROVIDER_SITE_OTHER): Payer: Self-pay | Admitting: Orthopedic Surgery

## 2018-08-07 NOTE — Telephone Encounter (Signed)
Patient called requesting copy of 02/02/2010 ov note. IC patient,lmvm advised note ready to pickup and to sign release form when comes to pick up. [phone]

## 2018-08-11 MED FILL — PROGESTERONE 100 MG CAPSULE: 100 | 30 days supply | Qty: 30 | Fill #0

## 2018-08-25 DIAGNOSIS — M542 Cervicalgia: Secondary | ICD-10-CM | POA: Diagnosis not present

## 2018-08-25 DIAGNOSIS — M25511 Pain in right shoulder: Secondary | ICD-10-CM | POA: Diagnosis not present

## 2018-08-25 DIAGNOSIS — M546 Pain in thoracic spine: Secondary | ICD-10-CM | POA: Diagnosis not present

## 2018-08-26 ENCOUNTER — Encounter: Payer: Self-pay | Admitting: Obstetrics & Gynecology

## 2018-08-26 DIAGNOSIS — Z1231 Encounter for screening mammogram for malignant neoplasm of breast: Secondary | ICD-10-CM | POA: Diagnosis not present

## 2018-08-29 ENCOUNTER — Encounter: Payer: Self-pay | Admitting: Obstetrics & Gynecology

## 2018-08-29 DIAGNOSIS — N6002 Solitary cyst of left breast: Secondary | ICD-10-CM | POA: Diagnosis not present

## 2018-08-29 DIAGNOSIS — N6321 Unspecified lump in the left breast, upper outer quadrant: Secondary | ICD-10-CM | POA: Diagnosis not present

## 2018-08-29 DIAGNOSIS — Z1231 Encounter for screening mammogram for malignant neoplasm of breast: Secondary | ICD-10-CM | POA: Diagnosis not present

## 2018-09-02 ENCOUNTER — Encounter: Payer: Self-pay | Admitting: Obstetrics & Gynecology

## 2018-09-02 ENCOUNTER — Ambulatory Visit (INDEPENDENT_AMBULATORY_CARE_PROVIDER_SITE_OTHER): Payer: PPO | Admitting: Obstetrics & Gynecology

## 2018-09-02 VITALS — BP 110/64 | HR 68 | Resp 16 | Ht 66.5 in | Wt 149.2 lb

## 2018-09-02 DIAGNOSIS — N951 Menopausal and female climacteric states: Secondary | ICD-10-CM | POA: Diagnosis not present

## 2018-09-02 DIAGNOSIS — Z01419 Encounter for gynecological examination (general) (routine) without abnormal findings: Secondary | ICD-10-CM

## 2018-09-02 DIAGNOSIS — N898 Other specified noninflammatory disorders of vagina: Secondary | ICD-10-CM

## 2018-09-02 MED ORDER — ZOLPIDEM TARTRATE 5 MG PO TABS: 5.0000 mg | ORAL_TABLET | Freq: Every evening | ORAL | 0 refills | Status: DC | PRN

## 2018-09-02 MED ORDER — ESTRADIOL 0.0375 MG/24HR TD PTTW: 1.0000 | MEDICATED_PATCH | TRANSDERMAL | 4 refills | Status: DC

## 2018-09-02 MED ORDER — PROGESTERONE MICRONIZED 100 MG PO CAPS: 100.0000 mg | ORAL_CAPSULE | Freq: Every day | ORAL | 4 refills | Status: DC

## 2018-09-02 MED ORDER — TRIAMCINOLONE ACETONIDE 0.1 % EX CREA: 1.0000 "application " | TOPICAL_CREAM | Freq: Two times a day (BID) | CUTANEOUS | 0 refills | Status: DC

## 2018-09-02 NOTE — Progress Notes (Signed)
68 y.o. [id number] Divorced White or Caucasian female here for annual exam.  Having some issues with hemorrhoids today.  Having a little bleeding.  This is common.    Planning trip to [place].    Daughter is getting married this summer in [place], [place].  Feels like she continues to have intermittent vaginal discharge.  Would like evaluation.  PCP:  Dr. Redmond School  Patient's last menstrual period was 07/30/2002.          Sexually active: No.  The current method of family planning is post menopausal status.    Exercising: Yes.    Swimming yoga running and gardening  Smoker:  no  Health Maintenance: Pap: 03/22/2016 negative HR HPV Neg    09/24/2012 neg Hr HPV Negative  History of abnormal Pap:  no MMG:  Done 07/2018.  Was called back due to breast cysts Colonoscopy:  no BMD:  no TDaP:  07/30/08, aware this is due. Pneumonia vaccine(s):  No.  Declines this vaccination. Shingrix:   No.  Declines this vaccination.  Hep C testing: no Screening Labs: PCP   reports that she has quit smoking. Her smoking use included cigarettes. She has a 1.50 pack-year smoking history. She has never used smokeless tobacco. She reports current alcohol use of about 5.0 standard drinks of alcohol per week. She reports that she does not use drugs.  Past Medical History:  Diagnosis Date  . Ankle fracture    right - no surgery required  . 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 Hemorrhoids    external    Past Surgical History:  Procedure Laterality Date  . adnoidectomy     as child  . [place]   x 1  . DILATATION & CURETTAGE/HYSTEROSCOPY WITH MYOSURE N/A 10/10/2015   Procedure: DILATATION & CURETTAGE/HYSTEROSCOPY WITH MYOSURE;  Surgeon: Megan Salon, MD;  Location: [facility];  Service: Gynecology;  Laterality: N/A;  . DILATION AND CURETTAGE OF UTERUS     MAB in 1993 or 1994    Current Outpatient Medications  Medication Sig Dispense Refill  . PREMARIN vaginal cream 1/2 gram pv weekly 42.5  g 1  . progesterone (PROMETRIUM) 100 MG capsule TAKE 1 CAPSULE BY MOUTH DAILY 30 capsule 0  . estradiol (VIVELLE-DOT) 0.0375 MG/24HR PLACE 1 PATCH ONTO THE SKIN 2 (TWO) TIMES A WEEK. 24 patch 1  . metroNIDAZOLE (METROGEL VAGINAL) 0.75 % vaginal gel Place 1 applicator vaginally twice weekly for 3 months. (Patient not taking: Reported on 09/02/2018) 70 g 3  . zolpidem (AMBIEN) 5 MG tablet TAKE 1 TABLET BY MOUTH AT BEDTIME AS NEEDED SLEEP (Patient not taking: Reported on 09/02/2018) 30 tablet 1   No current facility-administered medications for this visit.     Family History  Problem Relation Age of Onset  . Cancer Father        PROSTATE    Review of Systems  Genitourinary: Positive for vaginal discharge.    Exam:   BP 110/64   Pulse 68   Resp 16   Ht 5' 6.5" (1.689 m)   Wt 149 lb 3.2 oz (67.7 kg)   LMP 07/30/2002   BMI 23.72 kg/m   Height: 5' 6.5" (168.9 cm)  Ht Readings from Last 3 Encounters:  09/02/18 5' 6.5" (1.689 m)  05/23/17 5\' 7"  (1.702 m)  04/29/17 5\' 7"  (1.702 m)    General appearance: alert, cooperative and appears stated age Head: Normocephalic, without obvious abnormality, atraumatic Neck: no adenopathy,  supple, symmetrical, trachea midline and thyroid normal to inspection and palpation Lungs: clear to auscultation bilaterally Breasts: normal appearance, no masses or tenderness Heart: regular rate and rhythm Abdomen: soft, non-tender; bowel sounds normal; no masses,  no organomegaly Extremities: extremities normal, atraumatic, no cyanosis or edema Skin: Skin color, texture, turgor normal. No rashes or lesions Lymph nodes: Cervical, supraclavicular, and axillary nodes normal. No abnormal inguinal nodes palpated Neurologic: Grossly normal   Pelvic: External genitalia:  no lesions              Urethra:  normal appearing urethra with no masses, tenderness or lesions              Bartholins and Skenes: normal                 Vagina: normal appearing vagina with  normal color and discharge, no lesions              Cervix: no lesions              Pap taken: No. Bimanual Exam:  Uterus:  uterus absent              Adnexa: no mass, fullness, tenderness               Rectovaginal: Confirms               Anus:  normal sphincter tone, no lesions  Chaperone was present for exam.  A:  Well Woman with normal exam PMP, on HRT Recurrent vaginal discharge  P:   Mammogram release from [place] signed today. pap smear with neg HR HPV 8/17.  Will repeat with next exam. Vaginitis testing obtained today. RF of Vivelle dot 0.0375mg  patches twice weekly.  #24/4RF Prometrium 100mg  daily.  #90/4RF Willing to do cologuard.  Order placed.   Declines BMD. Tdap due.  Pt aware but declines having this done today. Return annually or prn

## 2018-09-03 MED FILL — PROGESTERONE 100 MG CAPSULE: 100 | 90 days supply | Qty: 90 | Fill #0

## 2018-09-04 LAB — NUSWAB BV AND CANDIDA, NAA
Atopobium vaginae: HIGH Score — AB
Candida albicans, NAA: NEGATIVE
Candida glabrata, NAA: NEGATIVE

## 2018-09-05 ENCOUNTER — Telehealth: Payer: Self-pay | Admitting: Obstetrics & Gynecology

## 2018-09-05 ENCOUNTER — Other Ambulatory Visit: Payer: Self-pay | Admitting: Obstetrics & Gynecology

## 2018-09-05 MED ORDER — NONFORMULARY OR COMPOUNDED ITEM: 0 refills | Status: DC

## 2018-09-05 MED ORDER — METRONIDAZOLE 0.75 % VA GEL: VAGINAL | 0 refills | Status: DC

## 2018-09-05 NOTE — Telephone Encounter (Signed)
Spoke with patient and message from Dr. [name] discussed.  Pt concerned about costs of medications.  Advised can review costs online for boric acid and return call if she would like Rx sent to [facility].  Rx for metrogel sent. Instructions given and questions answered.  Follow up encounter for office visit with Dr. [name] scheduled.  10/23/2018.  Will call back with any concerns.  Encounter closed.

## 2018-09-05 NOTE — Telephone Encounter (Signed)
Routing results and recommendations to you.  Thanks.

## 2018-09-05 NOTE — Telephone Encounter (Signed)
Dr. [name] -please review and advise on 2/4 Nuswab results.

## 2018-09-05 NOTE — Telephone Encounter (Signed)
Patient called to check on the status of her recent lab results in case treatment is needed.

## 2018-09-05 NOTE — Telephone Encounter (Signed)
-----   Message from Megan Salon, MD sent at 09/05/2018  5:18 PM EST ----- Please let her know BV testing is positive.  Would recommend metrogel 0.76%, one applicator QHS x 7 nights OR flagyl 500mg  bid x 7 days  This is then followed by boric acid 600mg  vaginal suppositories nightly nightly x 30 days.  Would recommend repeat testing a month after treatment is completed.

## 2018-09-09 ENCOUNTER — Telehealth: Payer: Self-pay | Admitting: Obstetrics & Gynecology

## 2018-09-09 NOTE — Telephone Encounter (Signed)
Spoke with patient. Patient requesting alternative to Metrogel due to cost, declines Flagyl PO due to GI side effects in the past. Patient states Metrogel has not been effective in the past for tx BV, requesting alternative to pharmacy on file. Advised I will review with Dr. [name] and return call with recommendations.   Dr. [name] -please advise on alternative Rx.

## 2018-09-09 NOTE — Telephone Encounter (Signed)
Patient is calling regarding recent diagnosis of BV. Patient has questions, as well as concerns about the cost of the medication.

## 2018-09-09 NOTE — Telephone Encounter (Signed)
Call reviewed with Dr. [name], call returned to patient. Reviewed option of metrogel followed by boric acid and repeat testing or boric acid and repeat testing. Patient is going to use Metrogel as previously prescribed. Reviewed option of GoodRx coupon at [facility], RX out of pocket $39.95. Patient will contact pharmacy directly to have Rx transferred to pharmacy. Patient declines RX for boric acid vaginal suppositories, will order from [facility]. Questions answered. Patient verbalizes understanding.   Routing to provider for final review. Patient is agreeable to disposition. Will close encounter.

## 2018-09-10 ENCOUNTER — Telehealth: Payer: Self-pay | Admitting: Obstetrics & Gynecology

## 2018-09-10 NOTE — Telephone Encounter (Signed)
[facility]'s [facility], [facility], called requesting to clarify instructions on a prescription for metronidazole that was transferred from another pharmacy.

## 2018-09-10 NOTE — Telephone Encounter (Signed)
Reviewed result notes and recommendations dated 09/02/18 per Dr. [name]. Metrogel 1.61%, one applicator QHS x 7 nights.  Call returned to [facility], spoke with Gerald Stabs. Confirmed Metrogel Rx, read back and confirmed.   Encounter closed.

## 2018-09-11 DIAGNOSIS — M25511 Pain in right shoulder: Secondary | ICD-10-CM | POA: Diagnosis not present

## 2018-09-11 DIAGNOSIS — M546 Pain in thoracic spine: Secondary | ICD-10-CM | POA: Diagnosis not present

## 2018-09-11 DIAGNOSIS — M542 Cervicalgia: Secondary | ICD-10-CM | POA: Diagnosis not present

## 2018-09-17 ENCOUNTER — Telehealth: Payer: Self-pay | Admitting: Family Medicine

## 2018-09-17 NOTE — Telephone Encounter (Signed)
ok 

## 2018-09-17 NOTE — Telephone Encounter (Signed)
Pt called and stated that she was informed by [facility] that she needs order for 2020 before she can schedule any more appts with them. She currently goes 1 time every other week. Orders need to be from 07/30/2018 and at this point she is unsure of a end date. Pt can be reached at [phone]. Please fax orders to [facility] at [phone].

## 2018-09-18 NOTE — Telephone Encounter (Signed)
Order written and needs to be signed. Will fax when done pt is aware. Cool

## 2018-10-03 DIAGNOSIS — L97211 Non-pressure chronic ulcer of right calf limited to breakdown of skin: Secondary | ICD-10-CM | POA: Diagnosis not present

## 2018-10-03 DIAGNOSIS — M25511 Pain in right shoulder: Secondary | ICD-10-CM | POA: Diagnosis not present

## 2018-10-03 DIAGNOSIS — M542 Cervicalgia: Secondary | ICD-10-CM | POA: Diagnosis not present

## 2018-10-03 DIAGNOSIS — M546 Pain in thoracic spine: Secondary | ICD-10-CM | POA: Diagnosis not present

## 2018-10-09 ENCOUNTER — Other Ambulatory Visit (HOSPITAL_COMMUNITY): Payer: Self-pay | Admitting: Family Medicine

## 2018-10-09 DIAGNOSIS — R230 Cyanosis: Secondary | ICD-10-CM

## 2018-10-10 ENCOUNTER — Ambulatory Visit (HOSPITAL_COMMUNITY)
Admission: RE | Admit: 2018-10-10 | Discharge: 2018-10-10 | Disposition: A | Discharge status: Home / Self Care | Payer: PPO | Source: Ambulatory Visit | Attending: Vascular Surgery | Admitting: Vascular Surgery

## 2018-10-10 ENCOUNTER — Other Ambulatory Visit: Payer: Self-pay

## 2018-10-10 DIAGNOSIS — M542 Cervicalgia: Secondary | ICD-10-CM | POA: Diagnosis not present

## 2018-10-10 DIAGNOSIS — R230 Cyanosis: Secondary | ICD-10-CM | POA: Insufficient documentation

## 2018-10-10 DIAGNOSIS — M546 Pain in thoracic spine: Secondary | ICD-10-CM | POA: Diagnosis not present

## 2018-10-10 DIAGNOSIS — B078 Other viral warts: Secondary | ICD-10-CM | POA: Diagnosis not present

## 2018-10-10 DIAGNOSIS — L814 Other melanin hyperpigmentation: Secondary | ICD-10-CM | POA: Diagnosis not present

## 2018-10-10 DIAGNOSIS — L820 Inflamed seborrheic keratosis: Secondary | ICD-10-CM | POA: Diagnosis not present

## 2018-10-10 DIAGNOSIS — M25511 Pain in right shoulder: Secondary | ICD-10-CM | POA: Diagnosis not present

## 2018-10-10 DIAGNOSIS — L570 Actinic keratosis: Secondary | ICD-10-CM | POA: Diagnosis not present

## 2018-10-10 DIAGNOSIS — Z85828 Personal history of other malignant neoplasm of skin: Secondary | ICD-10-CM | POA: Diagnosis not present

## 2018-10-15 DIAGNOSIS — M25511 Pain in right shoulder: Secondary | ICD-10-CM | POA: Diagnosis not present

## 2018-10-15 DIAGNOSIS — M546 Pain in thoracic spine: Secondary | ICD-10-CM | POA: Diagnosis not present

## 2018-10-15 DIAGNOSIS — M542 Cervicalgia: Secondary | ICD-10-CM | POA: Diagnosis not present

## 2018-10-20 ENCOUNTER — Telehealth: Payer: Self-pay | Admitting: Obstetrics & Gynecology

## 2018-10-20 NOTE — Telephone Encounter (Signed)
Patient cancelled her recheck appointment and will call back to reschedule.

## 2018-10-23 ENCOUNTER — Ambulatory Visit: Payer: Self-pay | Admitting: Obstetrics & Gynecology

## 2018-10-28 ENCOUNTER — Telehealth: Payer: Self-pay | Admitting: Obstetrics & Gynecology

## 2018-10-28 NOTE — Telephone Encounter (Signed)
Patient says she is having symptoms of bacterial vaginosis again.

## 2018-10-28 NOTE — Telephone Encounter (Signed)
Return call to patient. Reports return of BV symptoms that have been recurrent issue for her.  Last seen and treated 09-02-18. Patient reports vaginal discomfort, white/greyish discharge with odor. Per 09-02-18 office note, was to be retested following treatment. Recommended office visit for testing and evaluation. Patient declined and requests phone call with Dr [name]. Advised Dr [name] out of office for several weeks due to injury.  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 Advised Dr Talbert Nan will review and we will call back.

## 2018-10-29 NOTE — Telephone Encounter (Signed)
We can't treat this over the phone. She needs to be seen.

## 2018-10-29 NOTE — Telephone Encounter (Signed)
Spoke with patient. Advised of message as seen below from [place]. Patient declines OV at this time and would like to wait and see how she feels. Will return call if she would like an appointment.  Routing to provider and will close encounter.

## 2018-11-10 ENCOUNTER — Telehealth: Payer: Self-pay | Admitting: Obstetrics and Gynecology

## 2018-11-10 NOTE — Telephone Encounter (Signed)
This is Dr. Quincy Simmonds covering for Dr. [name] while she is out of the office.   Please let patient know that [facility] sent [place] a fax that she has not completed your Cologuard testing.    Please have her complete this screening.

## 2018-11-11 NOTE — Telephone Encounter (Signed)
Spoke with patient. Patient states that she would like to speak with Dr.Miller further about testing at her next visit before proceeding.   Routing to provider and will close encounter.

## 2018-12-16 ENCOUNTER — Other Ambulatory Visit: Payer: Self-pay | Admitting: Obstetrics & Gynecology

## 2018-12-16 ENCOUNTER — Telehealth: Payer: Self-pay | Admitting: Obstetrics & Gynecology

## 2018-12-16 MED ORDER — ZOLPIDEM TARTRATE 5 MG PO TABS: 5.0000 mg | ORAL_TABLET | Freq: Every evening | ORAL | 0 refills | Status: DC | PRN

## 2018-12-16 MED ORDER — TRIAMCINOLONE ACETONIDE 0.1 % EX CREA: 1.0000 "application " | TOPICAL_CREAM | Freq: Two times a day (BID) | CUTANEOUS | 0 refills | Status: DC

## 2018-12-16 MED FILL — TRIAMCINOLONE 0.1% CREAM: 0.1 | 15 days supply | Qty: 30 | Fill #0

## 2018-12-16 NOTE — Telephone Encounter (Signed)
Rx's sent electronically and/or faxed.  Ok to close encounter.

## 2018-12-16 NOTE — Telephone Encounter (Signed)
Spoke with patient. Patient states she was given printed RX for Ambien 5mg  tab and triamcinolone 0.1% cream on 09/02/18. She never had the prescriptions filled, does not want to take them in to the pharmacy at this time. Patient is requesting prescriptions for both be sent electronically to [facility]. Advised Dr. [name] will review, our office will return call. Patient is agreeable.  Dr. [name] -please advise on new Rx's.

## 2018-12-16 NOTE — Telephone Encounter (Signed)
Patient is calling requesting that a prescription be called in for Doltidem and for Triamcinolone Cream 0.1% to [facility].

## 2018-12-17 MED FILL — ZOLPIDEM TARTRATE 5 MG TAB: 5 | 30 days supply | Qty: 30 | Fill #0

## 2018-12-17 NOTE — Telephone Encounter (Signed)
Spoke with patient, notified of refills.   Encounter closed.

## 2018-12-31 MED FILL — PROGESTERONE 100 MG CAPSULE: 100 | 90 days supply | Qty: 90 | Fill #1

## 2018-12-31 MED FILL — ESTRADIOL PATCH 0.0375: 0.0375 | 84 days supply | Qty: 24 | Fill #0

## 2019-01-22 MED FILL — ESTRADIOL PATCH 0.0375: 0.0375 | 84 days supply | Qty: 24 | Fill #0

## 2019-01-22 MED FILL — PROGESTERONE 100 MG CAPSULE: 100 | 90 days supply | Qty: 90 | Fill #1

## 2019-01-28 ENCOUNTER — Ambulatory Visit: Payer: Self-pay | Admitting: Family Medicine

## 2019-03-02 ENCOUNTER — Other Ambulatory Visit: Payer: Self-pay

## 2019-05-15 ENCOUNTER — Encounter: Payer: Self-pay | Admitting: Family Medicine

## 2019-05-15 ENCOUNTER — Ambulatory Visit (INDEPENDENT_AMBULATORY_CARE_PROVIDER_SITE_OTHER): Payer: PPO | Admitting: Family Medicine

## 2019-05-15 ENCOUNTER — Other Ambulatory Visit: Payer: Self-pay

## 2019-05-15 VITALS — BP 118/78 | HR 66 | Temp 97.7°F

## 2019-05-15 DIAGNOSIS — M7062 Trochanteric bursitis, left hip: Secondary | ICD-10-CM

## 2019-05-15 MED ORDER — TRIAMCINOLONE ACETONIDE 40 MG/ML IJ SUSP
40.0000 mg | Freq: Once | INTRAMUSCULAR | Status: AC
  Administered 2019-05-15: 40 mg via INTRAMUSCULAR

## 2019-05-15 MED ORDER — BUPIVACAINE HCL 0.5 % IJ SOLN
50.0000 mL | Freq: Once | INTRAMUSCULAR | Status: AC
  Administered 2019-05-15: 13:00:00 50 mL

## 2019-05-15 NOTE — Addendum Note (Signed)
Addended by: Elyse Jarvis on: 05/15/2019 12:54 PM   Modules accepted: Orders

## 2019-05-15 NOTE — Progress Notes (Signed)
   Subjective:    Patient ID: Alicia Alvarez, female    DOB: 1950/12/26, 68 y.o.   MRN: [id number]  HPI She is again having difficulty with left hip discomfort.  She notes this especially when she lies down on her left side.  She has had difficulty with this in the past and did have an injection which worked quite well approximately 2 years ago.   Review of Systems     Objective:   Physical Exam Alert and in no distress.  Point tenderness over the greater trochanter is noted.  Full normal motion of the hip is noted.  Negative straight leg raising.       Assessment & Plan:  Trochanteric bursitis of left hip The area of maximum point tenderness was identified.  40 mg of Kenalog and 3 cc of Xylocaine was injected without difficulty.  She did get some relief of her symptoms.  She will keep me informed.  May possibly need to reinject.

## 2019-05-26 ENCOUNTER — Telehealth: Payer: Self-pay

## 2019-05-26 NOTE — Telephone Encounter (Signed)
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 She said she was walking in the park and stepped on a yellow jacket nest and was stung about 12 times on her legs, back, arms, and hands. She took 50 mg Benadryl and 600 mg of Ibuprofen and also used some topical cream she had on it. Pt. Stated she wanted to know what to do if she got worse, does she need to come here or go to an Urgent Care. Please advise.

## 2019-05-29 ENCOUNTER — Telehealth: Payer: Self-pay

## 2019-05-29 NOTE — Telephone Encounter (Signed)
Patient called and stated she stepped in poison ivy possibly Friday of last week and she is now covered in it and need something sent to the pharmacy and she is willing to do a virtual appointment if need be. She would rather just have a conversation with you and she can send you pictures via text if that will be helpful. Tried topical Cortizone that has not helped. It's getting worse by the day. Please advise.

## 2019-05-30 ENCOUNTER — Other Ambulatory Visit: Payer: Self-pay | Admitting: Family Medicine

## 2019-05-30 MED ORDER — PREDNISONE 10 MG (48) PO TBPK: ORAL_TABLET | ORAL | 0 refills | Status: DC

## 2019-05-30 NOTE — Progress Notes (Signed)
Prednisone called in to treat contact dermatitis

## 2019-06-15 ENCOUNTER — Other Ambulatory Visit: Payer: Self-pay

## 2019-06-15 ENCOUNTER — Ambulatory Visit (INDEPENDENT_AMBULATORY_CARE_PROVIDER_SITE_OTHER): Payer: PPO | Admitting: Family Medicine

## 2019-06-15 ENCOUNTER — Encounter: Payer: Self-pay | Admitting: Family Medicine

## 2019-06-15 VITALS — BP 120/72 | HR 61 | Temp 97.3°F

## 2019-06-15 DIAGNOSIS — L302 Cutaneous autosensitization: Secondary | ICD-10-CM | POA: Diagnosis not present

## 2019-06-15 MED ORDER — TRIAMCINOLONE ACETONIDE 0.1 % EX CREA: 1.0000 "application " | TOPICAL_CREAM | Freq: Two times a day (BID) | CUTANEOUS | 0 refills | Status: DC

## 2019-06-15 MED FILL — TRIAMCINOLONE 0.1% CREAM: 0.1 | 7 days supply | Qty: 30 | Fill #0

## 2019-06-15 NOTE — Progress Notes (Signed)
   Subjective:    Patient ID: Alicia Alvarez, female    DOB: 07/21/1951, 68 y.o.   MRN: [id number]  HPI She is here for follow-up visit after recently being treated with steroids for contact dermatitis.  Yesterday she noted the onset of a rash on the distal feet close to where her previous reaction was.   Review of Systems     Objective:   Physical Exam Alert and in no distress.  Residual changes are noted from her previous contact dermatitis.  Brownish lesions are noted however there also an area on the medial aspect of one ankle and the lateral aspect of another this more erythematous and splotchy.       Assessment & Plan:  Id reaction - Plan: triamcinolone cream (KENALOG) 0.1 % I explained that I thought this was probably an id reaction and recommended cool compresses, triamcinolone and Benadryl.  Also can add Zantac to the regimen.  She was comfortable with that.

## 2019-06-17 ENCOUNTER — Telehealth: Payer: Self-pay

## 2019-06-17 NOTE — Telephone Encounter (Signed)
Health department

## 2019-06-17 NOTE — Telephone Encounter (Signed)
LVM advising pt . [facility] °

## 2019-06-17 NOTE — Telephone Encounter (Signed)
Pt. Called stating her daughter was coming in town for Thanksgiving and her and her husband are going on a mission trip to [place], she wanted to know if you knew where she could get a yellow fever vaccination in [place], they live in [place] and couldn't find anywhere to get one.

## 2019-06-18 ENCOUNTER — Other Ambulatory Visit: Payer: Self-pay

## 2019-06-18 DIAGNOSIS — Z20822 Contact with and (suspected) exposure to covid-19: Secondary | ICD-10-CM

## 2019-06-21 LAB — NOVEL CORONAVIRUS, NAA: SARS-CoV-2, NAA: NOT DETECTED

## 2019-07-06 ENCOUNTER — Encounter: Payer: Self-pay | Admitting: Family Medicine

## 2019-07-06 ENCOUNTER — Other Ambulatory Visit: Payer: Self-pay

## 2019-07-06 ENCOUNTER — Ambulatory Visit (INDEPENDENT_AMBULATORY_CARE_PROVIDER_SITE_OTHER): Payer: PPO | Admitting: Family Medicine

## 2019-07-06 VITALS — BP 130/84 | HR 70 | Temp 97.9°F

## 2019-07-06 DIAGNOSIS — M7061 Trochanteric bursitis, right hip: Secondary | ICD-10-CM | POA: Diagnosis not present

## 2019-07-06 MED ORDER — TRIAMCINOLONE ACETONIDE 40 MG/ML IJ SUSP
40.0000 mg | Freq: Once | INTRAMUSCULAR | Status: AC
  Administered 2019-07-06: 40 mg via INTRAMUSCULAR

## 2019-07-06 MED ORDER — LIDOCAINE HCL 1 % IJ SOLN
10.0000 mL | Freq: Once | INTRAMUSCULAR | Status: AC
  Administered 2019-07-06: 17:00:00 10 mL via INTRADERMAL

## 2019-07-06 MED FILL — PROGESTERONE 100 MG CAPSULE: 100 | 90 days supply | Qty: 90 | Fill #2

## 2019-07-06 MED FILL — ESTRADIOL PATCH 0.0375: 0.0375 | 84 days supply | Qty: 24 | Fill #1

## 2019-07-06 NOTE — Progress Notes (Signed)
   Subjective:    Patient ID: Alicia Alvarez, female    DOB: 1951/05/19, 68 y.o.   MRN: [id number]  HPI She is here for consult concerning right hip pain.  This started to bother her several months ago when she had an injection into the left greater trochanteric bursa.  Since then she has noted more difficulty with the right side and also difficulty with sitting and sleeping.   Review of Systems     Objective:   Physical Exam Normal motion of the back and hip.  No tenderness over SI joint.  Slight tenderness over right greater trochanteric bursa.       Assessment & Plan:  Trochanteric bursitis of right hip The point of maximum pain was identified.  40 mg of Kenalog and 3 cc of Xylocaine was injected into that area however she did not get immediate relief of her symptoms. I explained that this is a little disconcerting but we will wait and see and if she continues have difficulty, will refer for ultrasound of the area.

## 2019-07-06 NOTE — Addendum Note (Signed)
Addended by: Elyse Jarvis on: 07/06/2019 05:03 PM   Modules accepted: Orders

## 2019-07-15 MED FILL — ESTRADIOL PATCH 0.0375: 0.0375 | 84 days supply | Qty: 24 | Fill #1

## 2019-07-15 MED FILL — PROGESTERONE 100 MG CAPSULE: 100 | 90 days supply | Qty: 90 | Fill #2

## 2019-07-29 ENCOUNTER — Encounter: Payer: Self-pay | Admitting: Family Medicine

## 2019-07-29 ENCOUNTER — Other Ambulatory Visit: Payer: Self-pay

## 2019-07-29 ENCOUNTER — Telehealth: Payer: Self-pay | Admitting: Internal Medicine

## 2019-07-29 ENCOUNTER — Ambulatory Visit (INDEPENDENT_AMBULATORY_CARE_PROVIDER_SITE_OTHER): Payer: PPO | Admitting: Family Medicine

## 2019-07-29 VITALS — Wt 140.0 lb

## 2019-07-29 DIAGNOSIS — M79673 Pain in unspecified foot: Secondary | ICD-10-CM

## 2019-07-29 NOTE — Telephone Encounter (Signed)
Pt called and states that she has hurt her right foot at her pinky toe. With her being a nurse, she is weight a boot but wants to make sure its not fractured. She is not having any swelling or bruising but having a lot of pain with weight bearing. She was offered 3pm appt but has another appt at 4pm and she wants you to take a look at this for her. Please advise if she needs to get an xray first or if she can be worked in before [facility].

## 2019-07-29 NOTE — Progress Notes (Signed)
   Subjective:    Patient ID: Alicia Alvarez, female    DOB: 1950/10/06, 68 y.o.   MRN: [id number]  HPI Documentation for virtual telephone encounter.  Documentation for virtual audio and video telecommunications through [place] encounter: The patient was located at home. The provider was located in the office. The patient did consent to this visit and is aware of possible charges through their insurance for this visit. The other persons participating in this telemedicine service were none. This virtual service is not related to other E/M service within previous 7 days. She has a 10-day history of difficulty with lateral shift toe pain.  No history of injury but she has been doing a lot more yoga than normal which has put some extra pressure on it.  She did get a boot and did wear it today and it did cause some pain.    Review of Systems     Objective:   Physical Exam Alert and in no distress otherwise not examined      Assessment & Plan:  Pain of foot, unspecified laterality Recommend she continue to use the boot and take pain medicine as needed.  If she continues have difficulty after several weeks, she is to call me and I will x-ray it.  She was comfortable with that.  Explained that x-ray at this point would not change the therapy

## 2019-07-29 NOTE — Telephone Encounter (Signed)
Pt had virtual today with DR. Redmond School

## 2019-08-13 ENCOUNTER — Encounter: Payer: Self-pay | Admitting: Family Medicine

## 2019-08-13 ENCOUNTER — Ambulatory Visit (INDEPENDENT_AMBULATORY_CARE_PROVIDER_SITE_OTHER): Payer: PPO | Admitting: Family Medicine

## 2019-08-13 ENCOUNTER — Other Ambulatory Visit: Payer: Self-pay

## 2019-08-13 VITALS — BP 128/86 | HR 82 | Temp 96.8°F

## 2019-08-13 DIAGNOSIS — M79673 Pain in unspecified foot: Secondary | ICD-10-CM

## 2019-08-13 NOTE — Progress Notes (Signed)
   Subjective:    Patient ID: Alicia Alvarez, female    DOB: 1951/01/26, 69 y.o.   MRN: [id number]  HPI She complains of a 3-week history of right lateral foot discomfort.  No history of injury to that area.  She has been doing yoga and putting that foot in an abnormal position.  She has been wearing a boot for the last 2 weeks.  She does not wear it constantly.   Review of Systems     Objective:   Physical Exam Right foot exam shows full motion of the foot.  No tenderness over the Achilles tendon, heel or over the head of the fifth metatarsal.  She does have tenderness proximal to the PIP joint.       Assessment & Plan:  Pain of foot, unspecified laterality Recommend continued supportive care.  Use the boot for another 2 weeks.  Also recommend multivitamin with vitamin D as well as heat.  If she continues have difficulty, referral to foot specialist would be appropriate as this is kind of an unusual spot to have pain especially since there is no history of injury.

## 2019-08-27 ENCOUNTER — Telehealth: Payer: Self-pay | Admitting: Family Medicine

## 2019-08-27 NOTE — Telephone Encounter (Signed)
Pt called and is requesting to go to physical therapy for her 5th fracture of the metatarsal, [facility] [address] Alicia Alvarez, [place], [place] [address] she can be reached at [phone]

## 2019-08-27 NOTE — Telephone Encounter (Signed)
ok 

## 2019-08-31 ENCOUNTER — Other Ambulatory Visit: Payer: Self-pay

## 2019-08-31 DIAGNOSIS — M79673 Pain in unspecified foot: Secondary | ICD-10-CM

## 2019-08-31 NOTE — Telephone Encounter (Signed)
Done [facility] 

## 2019-09-07 DIAGNOSIS — M79671 Pain in right foot: Secondary | ICD-10-CM | POA: Diagnosis not present

## 2019-09-07 DIAGNOSIS — M545 Low back pain: Secondary | ICD-10-CM | POA: Diagnosis not present

## 2019-09-07 DIAGNOSIS — R269 Unspecified abnormalities of gait and mobility: Secondary | ICD-10-CM | POA: Diagnosis not present

## 2019-09-14 DIAGNOSIS — M545 Low back pain: Secondary | ICD-10-CM | POA: Diagnosis not present

## 2019-09-14 DIAGNOSIS — M79671 Pain in right foot: Secondary | ICD-10-CM | POA: Diagnosis not present

## 2019-09-14 DIAGNOSIS — R269 Unspecified abnormalities of gait and mobility: Secondary | ICD-10-CM | POA: Diagnosis not present

## 2019-09-21 DIAGNOSIS — M545 Low back pain: Secondary | ICD-10-CM | POA: Diagnosis not present

## 2019-09-21 DIAGNOSIS — M79671 Pain in right foot: Secondary | ICD-10-CM | POA: Diagnosis not present

## 2019-09-21 DIAGNOSIS — R269 Unspecified abnormalities of gait and mobility: Secondary | ICD-10-CM | POA: Diagnosis not present

## 2019-09-24 DIAGNOSIS — R269 Unspecified abnormalities of gait and mobility: Secondary | ICD-10-CM | POA: Diagnosis not present

## 2019-09-24 DIAGNOSIS — M79671 Pain in right foot: Secondary | ICD-10-CM | POA: Diagnosis not present

## 2019-09-24 DIAGNOSIS — M545 Low back pain: Secondary | ICD-10-CM | POA: Diagnosis not present

## 2019-09-25 DIAGNOSIS — D485 Neoplasm of uncertain behavior of skin: Secondary | ICD-10-CM | POA: Diagnosis not present

## 2019-09-25 DIAGNOSIS — L111 Transient acantholytic dermatosis [Grover]: Secondary | ICD-10-CM | POA: Diagnosis not present

## 2019-09-25 DIAGNOSIS — Z85828 Personal history of other malignant neoplasm of skin: Secondary | ICD-10-CM | POA: Diagnosis not present

## 2019-09-25 DIAGNOSIS — B079 Viral wart, unspecified: Secondary | ICD-10-CM | POA: Diagnosis not present

## 2019-09-25 DIAGNOSIS — B078 Other viral warts: Secondary | ICD-10-CM | POA: Diagnosis not present

## 2019-09-25 DIAGNOSIS — L821 Other seborrheic keratosis: Secondary | ICD-10-CM | POA: Diagnosis not present

## 2019-09-25 DIAGNOSIS — L570 Actinic keratosis: Secondary | ICD-10-CM | POA: Diagnosis not present

## 2019-09-25 DIAGNOSIS — D2272 Melanocytic nevi of left lower limb, including hip: Secondary | ICD-10-CM | POA: Diagnosis not present

## 2019-09-25 DIAGNOSIS — L814 Other melanin hyperpigmentation: Secondary | ICD-10-CM | POA: Diagnosis not present

## 2019-09-25 DIAGNOSIS — Z419 Encounter for procedure for purposes other than remedying health state, unspecified: Secondary | ICD-10-CM | POA: Diagnosis not present

## 2019-09-25 DIAGNOSIS — D225 Melanocytic nevi of trunk: Secondary | ICD-10-CM | POA: Diagnosis not present

## 2019-09-25 MED FILL — TRIAMCINOLONE 0.1% CREAM: 0.1 | 21 days supply | Qty: 60 | Fill #0

## 2019-09-29 MED FILL — IMIQUIMOD 5 % CREA: 5 | 56 days supply | Qty: 24 | Fill #0

## 2019-09-30 HISTORY — PX: SKIN BIOPSY: SHX1

## 2019-10-02 DIAGNOSIS — M79671 Pain in right foot: Secondary | ICD-10-CM | POA: Diagnosis not present

## 2019-10-02 DIAGNOSIS — M545 Low back pain: Secondary | ICD-10-CM | POA: Diagnosis not present

## 2019-10-02 DIAGNOSIS — R269 Unspecified abnormalities of gait and mobility: Secondary | ICD-10-CM | POA: Diagnosis not present

## 2019-10-07 DIAGNOSIS — M545 Low back pain: Secondary | ICD-10-CM | POA: Diagnosis not present

## 2019-10-07 DIAGNOSIS — R269 Unspecified abnormalities of gait and mobility: Secondary | ICD-10-CM | POA: Diagnosis not present

## 2019-10-07 DIAGNOSIS — M79671 Pain in right foot: Secondary | ICD-10-CM | POA: Diagnosis not present

## 2019-10-13 ENCOUNTER — Encounter: Payer: Self-pay | Admitting: Family Medicine

## 2019-10-13 DIAGNOSIS — R269 Unspecified abnormalities of gait and mobility: Secondary | ICD-10-CM | POA: Diagnosis not present

## 2019-10-13 DIAGNOSIS — M79671 Pain in right foot: Secondary | ICD-10-CM | POA: Diagnosis not present

## 2019-10-13 DIAGNOSIS — M545 Low back pain: Secondary | ICD-10-CM | POA: Diagnosis not present

## 2019-10-19 DIAGNOSIS — M545 Low back pain: Secondary | ICD-10-CM | POA: Diagnosis not present

## 2019-10-19 DIAGNOSIS — M79671 Pain in right foot: Secondary | ICD-10-CM | POA: Diagnosis not present

## 2019-10-19 DIAGNOSIS — R269 Unspecified abnormalities of gait and mobility: Secondary | ICD-10-CM | POA: Diagnosis not present

## 2019-10-26 ENCOUNTER — Telehealth: Payer: Self-pay | Admitting: Family Medicine

## 2019-10-26 NOTE — Telephone Encounter (Signed)
Pt called and said she has been having trouble sleeping and wanted to see if she could get some Ambien called in to the [facility]

## 2019-10-26 NOTE — Telephone Encounter (Signed)
Have her set up a virtual visit so we can discuss this

## 2019-10-26 NOTE — Telephone Encounter (Signed)
VM for pt to call office back to schedule an appt. [place]

## 2019-10-27 ENCOUNTER — Other Ambulatory Visit: Payer: Self-pay | Admitting: Obstetrics & Gynecology

## 2019-10-27 DIAGNOSIS — Z01419 Encounter for gynecological examination (general) (routine) without abnormal findings: Secondary | ICD-10-CM

## 2019-10-27 MED ORDER — ZOLPIDEM TARTRATE 5 MG PO TABS: 5.0000 mg | ORAL_TABLET | Freq: Every evening | ORAL | 0 refills | Status: DC | PRN

## 2019-10-27 NOTE — Telephone Encounter (Signed)
Med refill request: Ambien 5mg  Last AEX: 09/02/2018 Next AEX: not scheduled. Spoke to pt. Pt states not wanting to have AEX at this time.  Last MMG (if hormonal med) 08/29/2018 [place], 2 benign.  Refill authorized: # 30, 0 RF. Pended if approved.

## 2019-10-27 NOTE — Telephone Encounter (Signed)
Routed to refill pool by mistake, to triage pool.

## 2019-10-27 NOTE — Telephone Encounter (Signed)
Patient is asking for a new prescription for zolpidem (AMBIEN) 5 MG tablet. [facility].

## 2019-10-27 NOTE — Telephone Encounter (Signed)
Patient called checking refill status.

## 2019-10-28 MED FILL — ZOLPIDEM TARTRATE 5 MG TAB: 5 | 30 days supply | Qty: 30 | Fill #0

## 2019-10-28 NOTE — Telephone Encounter (Signed)
LVM for pt to call back for a virtual visit. [place]

## 2019-11-02 MED FILL — diazePAM 5 MG TABS: 5 | 7 days supply | Qty: 10 | Fill #0

## 2019-11-02 MED FILL — ONDANSETRON ODT 8 MG TABLET: 8 | 3 days supply | Qty: 4 | Fill #0

## 2019-11-02 MED FILL — CloNIDine HCL 0.1 MG TAB: 0.1 | 3 days supply | Qty: 3 | Fill #0

## 2019-11-02 MED FILL — ACETAMINOPHEN/COD #3 TABLET: 300-30 | 7 days supply | Qty: 12 | Fill #0

## 2019-11-03 DIAGNOSIS — M791 Myalgia, unspecified site: Secondary | ICD-10-CM | POA: Diagnosis not present

## 2019-11-03 DIAGNOSIS — M6281 Muscle weakness (generalized): Secondary | ICD-10-CM | POA: Diagnosis not present

## 2019-11-03 DIAGNOSIS — M545 Low back pain: Secondary | ICD-10-CM | POA: Diagnosis not present

## 2019-11-06 MED FILL — TRIAMCINOLONE 0.1% CREAM: 0.1 | 21 days supply | Qty: 80 | Fill #0

## 2019-11-13 MED FILL — ACETAMINOPHEN/COD #3 TABLET: 300-30 | 7 days supply | Qty: 12 | Fill #1

## 2019-11-18 DIAGNOSIS — H01002 Unspecified blepharitis right lower eyelid: Secondary | ICD-10-CM | POA: Diagnosis not present

## 2019-11-18 DIAGNOSIS — H1789 Other corneal scars and opacities: Secondary | ICD-10-CM | POA: Diagnosis not present

## 2019-11-18 DIAGNOSIS — H1031 Unspecified acute conjunctivitis, right eye: Secondary | ICD-10-CM | POA: Diagnosis not present

## 2019-11-18 MED FILL — NEO/POLYMYXIN/DEXAMETH DROP: 3.5-10000-0 | 30 days supply | Qty: 5 | Fill #0

## 2019-11-18 MED FILL — OFLOXACIN 0.3% EYE DROPS: 0.3 | 18 days supply | Qty: 5 | Fill #0

## 2019-11-18 MED FILL — ERYTHROMYCIN EYE OINTMENT: 5 | 7 days supply | Qty: 4 | Fill #0

## 2019-12-31 ENCOUNTER — Telehealth: Payer: Self-pay

## 2019-12-31 NOTE — Telephone Encounter (Signed)
Spoke with pt. Pt has AEX appt scheduled with Dr [name] on 01/04/20 at 3 pm. Pt wanting to know if can have labs done same day as appt. Pt advised can talk with Dr [name] about lab work at visit. Pt states has not had complete blood work in [employer]. Pt agreeable and verbalized understanding.   Routing to Dr [name] for review.  Encounter closed.

## 2019-12-31 NOTE — Telephone Encounter (Signed)
Patient has appointment for AEX on (01/04/20). Patient stated she would like to have blood work done at this appointment and to discuss further with nurse.

## 2020-01-01 DIAGNOSIS — M545 Low back pain: Secondary | ICD-10-CM | POA: Diagnosis not present

## 2020-01-01 DIAGNOSIS — M25551 Pain in right hip: Secondary | ICD-10-CM | POA: Diagnosis not present

## 2020-01-01 DIAGNOSIS — M791 Myalgia, unspecified site: Secondary | ICD-10-CM | POA: Diagnosis not present

## 2020-01-01 NOTE — Telephone Encounter (Signed)
Spoke with patient. Advised per Dr. [name]. Patient states she has not had or needed fasting labs in the past, would prefer to discuss labs at AEX, will return at a later date for fasting labs, if needed. Does not have labs with PCP. Patient thankful for return call.   Routing to provider for final review. Patient is agreeable to disposition. Will close encounter.

## 2020-01-01 NOTE — Telephone Encounter (Signed)
She could come first in the morning for fasting lab work and I could place orders and then have appt at 3pm.  It's too late in the day to ask someone to fast.  I will enter orders if she desires this.

## 2020-01-01 NOTE — Progress Notes (Signed)
69 y.o. [id number] Divorced White or Caucasian female here for annual exam.    Daughter got [place] last summer in [place].  [place]  Daughter has been here to visit since then.  Had possible metatarsal fracture and wore a boot last year.  This caused back issues.  Doing PT for her back/butt.    Had trip to [place] last year that was cancelled.  Daughter is going to go with her possibly in January.    Patient's last menstrual period was 07/30/2002.          Sexually active: No.  The current method of family planning is post menopausal status.    Exercising: Yes.    swimming, walking & yoga Smoker:  no  Health Maintenance: Pap:  10-22-12 neg HPV HR neg, 03-22-16 neg HPV HR neg History of abnormal Pap:  no MMG:  07/2018 bilateral & left breast u/s birads 2:neg Colonoscopy:  none BMD:   none TDaP:  2010.  D/w pt today.  Declines.   Pneumonia vaccine(s):  declines Shingrix:   declines Hep C testing: not done Screening Labs: obtained today   reports that she has quit smoking. Her smoking use included cigarettes. She has a 1.50 pack-year smoking history. She has never used smokeless tobacco. She reports current alcohol use of about 2.0 - 3.0 standard drinks of alcohol per week. She reports that she does not use drugs.  Past Medical History:  Diagnosis Date  . Ankle fracture    right - no surgery required  . 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 Hemorrhoids    external    Past Surgical History:  Procedure Laterality Date  . adnoidectomy     as child  . [place]   x 1  . DILATATION & CURETTAGE/HYSTEROSCOPY WITH MYOSURE N/A 10/10/2015   Procedure: DILATATION & CURETTAGE/HYSTEROSCOPY WITH MYOSURE;  Surgeon: Megan Salon, MD;  Location: [facility];  Service: Gynecology;  Laterality: N/A;  . DILATION AND CURETTAGE OF UTERUS     MAB in 1993 or 1994  . SKIN BIOPSY Left 09/30/2019   verruca vulgaris, irritated    Current Outpatient Medications   Medication Sig Dispense Refill  . estradiol (VIVELLE-DOT) 0.0375 MG/24HR Place 1 patch onto the skin 2 (two) times a week. 24 patch 4  . progesterone (PROMETRIUM) 100 MG capsule Take 1 capsule (100 mg total) by mouth daily. 90 capsule 4  . triamcinolone cream (KENALOG) 0.1 %      No current facility-administered medications for this visit.    Family History  Problem Relation Age of Onset  . Prostate cancer Father     Review of Systems  Constitutional: Negative.   HENT: Negative.   Eyes: Negative.   Respiratory: Negative.   Cardiovascular: Negative.   Gastrointestinal: Negative.   Endocrine: Negative.   Genitourinary: Negative.   Musculoskeletal: Negative.   Skin: Negative.   Allergic/Immunologic: Negative.   Neurological: Negative.   Hematological: Negative.   Psychiatric/Behavioral: Negative.     Exam:   BP 110/70   Pulse 68   Temp (!) 97 F (36.1 C) (Skin)   Resp 16   Ht 5' 6.5" (1.689 m)   Wt 146 lb (66.2 kg)   LMP 07/30/2002   BMI 23.21 kg/m   Height: 5' 6.5" (168.9 cm)  General appearance: alert, cooperative and appears stated age Head: Normocephalic, without obvious abnormality, atraumatic Neck: no adenopathy, supple, symmetrical, trachea midline and thyroid normal  to inspection and palpation Lungs: clear to auscultation bilaterally Breasts: normal appearance, no masses or tenderness Heart: regular rate and rhythm Abdomen: soft, non-tender; bowel sounds normal; no masses,  no organomegaly Extremities: extremities normal, atraumatic, no cyanosis or edema Skin: Skin color, texture, turgor normal. No rashes or lesions Lymph nodes: Cervical, supraclavicular, and axillary nodes normal. No abnormal inguinal nodes palpated Neurologic: Grossly normal   Pelvic: External genitalia:  no lesions              Urethra:  normal appearing urethra with no masses, tenderness or lesions              Bartholins and Skenes: normal                 Vagina: normal appearing  vagina with normal color and discharge, no lesions              Cervix: no lesions              Pap taken: Yes.   Bimanual Exam:  Uterus:  normal size, contour, position, consistency, mobility, non-tender              Adnexa: no mass, fullness, tenderness               Rectovaginal: Confirms               Anus:  normal sphincter tone, significant hemorrhoids both internal and external noted today Chaperone, Terence Lux, CMA, was present for exam.  A:  Well Woman with normal exam PMP, on HRT Hemorrhoids  P:   Mammogram guidelines reviewed.  Pt aware should have done yearly if on HRT.  States she will schedule. Declines BMD as would not do any treatment pap smear obtained today CBC, CMP, Lipids (non fasting), TSH and Vit D obtained today Has declined colonoscopy due willing to do cologuard.  Order placed.   RF for estradiol patch that she uses only once weekly.  #24/0RF.  Will need MMG prior to RF. RF for Prometrium 100mg  nightly   #90/0RF rx for hydrocortisone rectal suppositories BID with hemorrhoids.  #24/1RF Return annually or prn

## 2020-01-04 ENCOUNTER — Other Ambulatory Visit: Payer: Self-pay

## 2020-01-04 ENCOUNTER — Encounter: Payer: Self-pay | Admitting: Obstetrics & Gynecology

## 2020-01-04 ENCOUNTER — Ambulatory Visit (INDEPENDENT_AMBULATORY_CARE_PROVIDER_SITE_OTHER): Payer: PPO | Admitting: Obstetrics & Gynecology

## 2020-01-04 ENCOUNTER — Other Ambulatory Visit (HOSPITAL_COMMUNITY)
Admission: RE | Admit: 2020-01-04 | Discharge: 2020-01-04 | Disposition: A | Discharge status: Home / Self Care | Payer: PPO | Source: Ambulatory Visit | Attending: Obstetrics & Gynecology | Admitting: Obstetrics & Gynecology

## 2020-01-04 VITALS — BP 110/70 | HR 68 | Temp 97.0°F | Resp 16 | Ht 66.5 in | Wt 146.0 lb

## 2020-01-04 DIAGNOSIS — Z0000 Encounter for general adult medical examination without abnormal findings: Secondary | ICD-10-CM | POA: Diagnosis not present

## 2020-01-04 DIAGNOSIS — N951 Menopausal and female climacteric states: Secondary | ICD-10-CM | POA: Diagnosis not present

## 2020-01-04 DIAGNOSIS — Z01419 Encounter for gynecological examination (general) (routine) without abnormal findings: Secondary | ICD-10-CM

## 2020-01-04 DIAGNOSIS — Z124 Encounter for screening for malignant neoplasm of cervix: Secondary | ICD-10-CM | POA: Diagnosis not present

## 2020-01-04 DIAGNOSIS — Z79890 Hormone replacement therapy (postmenopausal): Secondary | ICD-10-CM | POA: Insufficient documentation

## 2020-01-04 MED ORDER — PROGESTERONE MICRONIZED 100 MG PO CAPS: ORAL_CAPSULE | ORAL | 0 refills | Status: DC

## 2020-01-04 MED ORDER — ESTRADIOL 0.0375 MG/24HR TD PTTW: 1.0000 | MEDICATED_PATCH | TRANSDERMAL | 0 refills | Status: DC

## 2020-01-04 MED ORDER — HYDROCORTISONE ACETATE 25 MG RE SUPP
25.0000 mg | Freq: Two times a day (BID) | RECTAL | 1 refills | Status: DC
Start: 2020-01-04 — End: 2021-02-16

## 2020-01-05 LAB — LIPID PANEL
Chol/HDL Ratio: 2 ratio (ref 0.0–4.4)
Cholesterol, Total: 213 mg/dL — ABNORMAL HIGH (ref 100–199)
HDL: 109 mg/dL (ref >39)
LDL Chol Calc (NIH): 92 mg/dL (ref 0–99)
Triglycerides: 69 mg/dL (ref 0–149)
VLDL Cholesterol Cal: 12 mg/dL (ref 5–40)

## 2020-01-05 LAB — COMPREHENSIVE METABOLIC PANEL
ALT: 20 IU/L (ref 0–32)
AST: 23 IU/L (ref 0–40)
Albumin/Globulin Ratio: 2.3 — ABNORMAL HIGH (ref 1.2–2.2)
Albumin: 4.3 g/dL (ref 3.8–4.8)
Alkaline Phosphatase: 81 IU/L (ref 48–121)
BUN/Creatinine Ratio: 20 (ref 12–28)
BUN: 13 mg/dL (ref 8–27)
Bilirubin Total: 0.2 mg/dL (ref 0.0–1.2)
CO2: 26 mmol/L (ref 20–29)
Calcium: 9.8 mg/dL (ref 8.7–10.3)
Chloride: 101 mmol/L (ref 96–106)
Creatinine, Ser: 0.65 mg/dL (ref 0.57–1.00)
GFR calc Af Amer: 105 mL/min/{1.73_m2} (ref >59)
GFR calc non Af Amer: 92 mL/min/{1.73_m2} (ref >59)
Globulin, Total: 1.9 g/dL (ref 1.5–4.5)
Glucose: 89 mg/dL (ref 65–99)
Potassium: 3.9 mmol/L (ref 3.5–5.2)
Sodium: 138 mmol/L (ref 134–144)
Total Protein: 6.2 g/dL (ref 6.0–8.5)

## 2020-01-05 LAB — CBC
Hematocrit: 38.1 % (ref 34.0–46.6)
Hemoglobin: 12.8 g/dL (ref 11.1–15.9)
MCH: 30.5 pg (ref 26.6–33.0)
MCHC: 33.6 g/dL (ref 31.5–35.7)
MCV: 91 fL (ref 79–97)
Platelets: 211 10*3/uL (ref 150–450)
RBC: 4.19 x10E6/uL (ref 3.77–5.28)
RDW: 13.1 % (ref 11.7–15.4)
WBC: 6.3 10*3/uL (ref 3.4–10.8)

## 2020-01-05 LAB — TSH: TSH: 2.22 u[IU]/mL (ref 0.450–4.500)

## 2020-01-05 LAB — CYTOLOGY - PAP
Adequacy: ABSENT
Diagnosis: NEGATIVE

## 2020-01-05 LAB — VITAMIN D 25 HYDROXY (VIT D DEFICIENCY, FRACTURES): Vit D, 25-Hydroxy: 32 ng/mL (ref 30.0–100.0)

## 2020-01-07 DIAGNOSIS — M25551 Pain in right hip: Secondary | ICD-10-CM | POA: Diagnosis not present

## 2020-01-07 DIAGNOSIS — M791 Myalgia, unspecified site: Secondary | ICD-10-CM | POA: Diagnosis not present

## 2020-01-07 DIAGNOSIS — M545 Low back pain: Secondary | ICD-10-CM | POA: Diagnosis not present

## 2020-01-14 DIAGNOSIS — M545 Low back pain: Secondary | ICD-10-CM | POA: Diagnosis not present

## 2020-01-14 DIAGNOSIS — M25551 Pain in right hip: Secondary | ICD-10-CM | POA: Diagnosis not present

## 2020-01-14 DIAGNOSIS — M791 Myalgia, unspecified site: Secondary | ICD-10-CM | POA: Diagnosis not present

## 2020-01-19 MED FILL — ESTRADIOL PATCH 0.0375: 0.0375 | 84 days supply | Qty: 24 | Fill #0

## 2020-01-19 MED FILL — PROGESTERONE 100 MG CAPSULE: 100 | 90 days supply | Qty: 45 | Fill #0

## 2020-01-21 DIAGNOSIS — M545 Low back pain: Secondary | ICD-10-CM | POA: Diagnosis not present

## 2020-01-21 DIAGNOSIS — M25551 Pain in right hip: Secondary | ICD-10-CM | POA: Diagnosis not present

## 2020-01-21 DIAGNOSIS — M791 Myalgia, unspecified site: Secondary | ICD-10-CM | POA: Diagnosis not present

## 2020-01-28 DIAGNOSIS — M25551 Pain in right hip: Secondary | ICD-10-CM | POA: Diagnosis not present

## 2020-01-28 DIAGNOSIS — M791 Myalgia, unspecified site: Secondary | ICD-10-CM | POA: Diagnosis not present

## 2020-01-28 DIAGNOSIS — M545 Low back pain: Secondary | ICD-10-CM | POA: Diagnosis not present

## 2020-02-02 DIAGNOSIS — M545 Low back pain: Secondary | ICD-10-CM | POA: Diagnosis not present

## 2020-02-02 DIAGNOSIS — M25551 Pain in right hip: Secondary | ICD-10-CM | POA: Diagnosis not present

## 2020-02-02 DIAGNOSIS — M791 Myalgia, unspecified site: Secondary | ICD-10-CM | POA: Diagnosis not present

## 2020-02-11 DIAGNOSIS — M25551 Pain in right hip: Secondary | ICD-10-CM | POA: Diagnosis not present

## 2020-02-11 DIAGNOSIS — M791 Myalgia, unspecified site: Secondary | ICD-10-CM | POA: Diagnosis not present

## 2020-02-11 DIAGNOSIS — M545 Low back pain: Secondary | ICD-10-CM | POA: Diagnosis not present

## 2020-02-18 DIAGNOSIS — M545 Low back pain: Secondary | ICD-10-CM | POA: Diagnosis not present

## 2020-02-18 DIAGNOSIS — M25551 Pain in right hip: Secondary | ICD-10-CM | POA: Diagnosis not present

## 2020-02-18 DIAGNOSIS — M791 Myalgia, unspecified site: Secondary | ICD-10-CM | POA: Diagnosis not present

## 2020-02-24 DIAGNOSIS — C44612 Basal cell carcinoma of skin of right upper limb, including shoulder: Secondary | ICD-10-CM | POA: Diagnosis not present

## 2020-02-24 DIAGNOSIS — D0439 Carcinoma in situ of skin of other parts of face: Secondary | ICD-10-CM | POA: Diagnosis not present

## 2020-02-24 DIAGNOSIS — Z85828 Personal history of other malignant neoplasm of skin: Secondary | ICD-10-CM | POA: Diagnosis not present

## 2020-02-24 DIAGNOSIS — L570 Actinic keratosis: Secondary | ICD-10-CM | POA: Diagnosis not present

## 2020-02-24 DIAGNOSIS — D485 Neoplasm of uncertain behavior of skin: Secondary | ICD-10-CM | POA: Diagnosis not present

## 2020-02-24 DIAGNOSIS — Z419 Encounter for procedure for purposes other than remedying health state, unspecified: Secondary | ICD-10-CM | POA: Diagnosis not present

## 2020-02-25 DIAGNOSIS — M791 Myalgia, unspecified site: Secondary | ICD-10-CM | POA: Diagnosis not present

## 2020-02-25 DIAGNOSIS — M545 Low back pain: Secondary | ICD-10-CM | POA: Diagnosis not present

## 2020-02-25 DIAGNOSIS — M25551 Pain in right hip: Secondary | ICD-10-CM | POA: Diagnosis not present

## 2020-02-26 HISTORY — PX: SKIN BIOPSY: SHX1

## 2020-03-02 ENCOUNTER — Encounter: Payer: Self-pay | Admitting: Family Medicine

## 2020-03-08 DIAGNOSIS — I83893 Varicose veins of bilateral lower extremities with other complications: Secondary | ICD-10-CM | POA: Diagnosis not present

## 2020-03-08 DIAGNOSIS — I8311 Varicose veins of right lower extremity with inflammation: Secondary | ICD-10-CM | POA: Diagnosis not present

## 2020-03-08 DIAGNOSIS — I8312 Varicose veins of left lower extremity with inflammation: Secondary | ICD-10-CM | POA: Diagnosis not present

## 2020-03-08 DIAGNOSIS — I83813 Varicose veins of bilateral lower extremities with pain: Secondary | ICD-10-CM | POA: Diagnosis not present

## 2020-03-10 DIAGNOSIS — M25551 Pain in right hip: Secondary | ICD-10-CM | POA: Diagnosis not present

## 2020-03-10 DIAGNOSIS — M791 Myalgia, unspecified site: Secondary | ICD-10-CM | POA: Diagnosis not present

## 2020-03-10 DIAGNOSIS — M545 Low back pain: Secondary | ICD-10-CM | POA: Diagnosis not present

## 2020-04-06 DIAGNOSIS — I8311 Varicose veins of right lower extremity with inflammation: Secondary | ICD-10-CM | POA: Diagnosis not present

## 2020-04-06 DIAGNOSIS — I8312 Varicose veins of left lower extremity with inflammation: Secondary | ICD-10-CM | POA: Diagnosis not present

## 2020-04-14 DIAGNOSIS — I8312 Varicose veins of left lower extremity with inflammation: Secondary | ICD-10-CM | POA: Diagnosis not present

## 2020-04-14 DIAGNOSIS — I83893 Varicose veins of bilateral lower extremities with other complications: Secondary | ICD-10-CM | POA: Diagnosis not present

## 2020-04-14 DIAGNOSIS — I83813 Varicose veins of bilateral lower extremities with pain: Secondary | ICD-10-CM | POA: Diagnosis not present

## 2020-04-14 DIAGNOSIS — I8311 Varicose veins of right lower extremity with inflammation: Secondary | ICD-10-CM | POA: Diagnosis not present

## 2020-05-04 ENCOUNTER — Other Ambulatory Visit: Payer: Self-pay | Admitting: Obstetrics & Gynecology

## 2020-05-04 DIAGNOSIS — N951 Menopausal and female climacteric states: Secondary | ICD-10-CM

## 2020-05-04 NOTE — Telephone Encounter (Signed)
Medication refill request: Estradiol  Last AEX:  01-04-20 SM  Next AEX: 03-09-21 Last MMG (if hormonal medication request): 08-29-2018 density C/BIRADS 2 benign  Refill authorized: Today, please advise.  Medication refill request: Progesterone  Refill authorized: Today, please advise.   Call to patient. Patient advised, per review of aex note with Dr. [name] on 01-04-20, patient will need to schedule MMG prior to receiving refill of HRT. Patient states that she is planning to schedule MMG in January. RN offered to call [facility] and schedule sooner since last MMG was in 07/2018, but patient declined stating she is having vein ablation surgery this Fall and then has plans to travel for the holidays, so January is earliest she can do MMG. RN advised would need to review request with Dr. [name]. Patient verbalized understanding and agreeable. Advised would return call if any additional recommendations. Pharmacy confirmed as [facility].   Medications pended for #90, 0RF pending review by Dr. [name].

## 2020-05-04 NOTE — Telephone Encounter (Signed)
Please let her know her RF has been completed.  As she is on HRT, she needs a yearly mammogram.  So going forward, I would like for her to plan for this.  Thanks.

## 2020-05-05 ENCOUNTER — Other Ambulatory Visit (HOSPITAL_COMMUNITY): Payer: Self-pay | Admitting: Obstetrics & Gynecology

## 2020-05-05 MED FILL — PROGESTERONE 100 MG CAPSULE: 100 | 90 days supply | Qty: 45 | Fill #0

## 2020-05-05 MED FILL — ESTRADIOL PATCH 0.0375: 0.0375 | 84 days supply | Qty: 24 | Fill #0

## 2020-05-05 NOTE — Telephone Encounter (Signed)
Call to patient. Patient advised medications sent to pharmacy. Advised patient of importance of yearly mammogram while being on HRT and patient verbalized understanding. Appreciative of phone call.   Encounter closed.

## 2020-05-30 ENCOUNTER — Telehealth: Payer: Self-pay

## 2020-05-30 ENCOUNTER — Other Ambulatory Visit: Payer: Self-pay | Admitting: Obstetrics & Gynecology

## 2020-05-30 ENCOUNTER — Other Ambulatory Visit: Payer: Self-pay | Admitting: Obstetrics and Gynecology

## 2020-05-30 DIAGNOSIS — I8312 Varicose veins of left lower extremity with inflammation: Secondary | ICD-10-CM | POA: Diagnosis not present

## 2020-05-30 DIAGNOSIS — Z01419 Encounter for gynecological examination (general) (routine) without abnormal findings: Secondary | ICD-10-CM

## 2020-05-30 DIAGNOSIS — I83812 Varicose veins of left lower extremities with pain: Secondary | ICD-10-CM | POA: Diagnosis not present

## 2020-05-30 MED ORDER — ZOLPIDEM TARTRATE 5 MG PO TABS: 5.0000 mg | ORAL_TABLET | Freq: Every evening | ORAL | 0 refills | Status: DC | PRN

## 2020-05-30 MED FILL — ZOLPIDEM TARTRATE 5 MG TAB: 5 | 30 days supply | Qty: 30 | Fill #0

## 2020-05-30 NOTE — Telephone Encounter (Signed)
Pt calling and asking for a refill on her Zolpidem 5mg  - no longer on her med list.   Medication refill request: Zolpidem 5mg   Last AEX:  01/04/20 Next AEX: 03/09/21  Last MMG (if hormonal medication request): NA Refill authorized: not on med list

## 2020-05-30 NOTE — Telephone Encounter (Signed)
Rx completed.  Thanks.

## 2020-05-30 NOTE — Telephone Encounter (Signed)
Patient is calling in regars to refill of Ambien.

## 2020-06-03 DIAGNOSIS — I8312 Varicose veins of left lower extremity with inflammation: Secondary | ICD-10-CM | POA: Diagnosis not present

## 2020-06-08 DIAGNOSIS — L821 Other seborrheic keratosis: Secondary | ICD-10-CM | POA: Diagnosis not present

## 2020-06-08 DIAGNOSIS — Z85828 Personal history of other malignant neoplasm of skin: Secondary | ICD-10-CM | POA: Diagnosis not present

## 2020-06-08 DIAGNOSIS — L570 Actinic keratosis: Secondary | ICD-10-CM | POA: Diagnosis not present

## 2020-06-10 DIAGNOSIS — I8312 Varicose veins of left lower extremity with inflammation: Secondary | ICD-10-CM | POA: Diagnosis not present

## 2020-06-10 DIAGNOSIS — I83811 Varicose veins of right lower extremities with pain: Secondary | ICD-10-CM | POA: Diagnosis not present

## 2020-06-17 DIAGNOSIS — I8311 Varicose veins of right lower extremity with inflammation: Secondary | ICD-10-CM | POA: Diagnosis not present

## 2020-06-17 DIAGNOSIS — I83811 Varicose veins of right lower extremities with pain: Secondary | ICD-10-CM | POA: Diagnosis not present

## 2020-06-27 DIAGNOSIS — I83892 Varicose veins of left lower extremities with other complications: Secondary | ICD-10-CM | POA: Diagnosis not present

## 2020-06-27 DIAGNOSIS — I8312 Varicose veins of left lower extremity with inflammation: Secondary | ICD-10-CM | POA: Diagnosis not present

## 2020-06-27 DIAGNOSIS — M7981 Nontraumatic hematoma of soft tissue: Secondary | ICD-10-CM | POA: Diagnosis not present

## 2020-07-11 DIAGNOSIS — M7981 Nontraumatic hematoma of soft tissue: Secondary | ICD-10-CM | POA: Diagnosis not present

## 2020-07-11 DIAGNOSIS — I83811 Varicose veins of right lower extremities with pain: Secondary | ICD-10-CM | POA: Diagnosis not present

## 2020-07-11 DIAGNOSIS — I8311 Varicose veins of right lower extremity with inflammation: Secondary | ICD-10-CM | POA: Diagnosis not present

## 2020-07-19 DIAGNOSIS — I8312 Varicose veins of left lower extremity with inflammation: Secondary | ICD-10-CM | POA: Diagnosis not present

## 2020-07-19 DIAGNOSIS — M7981 Nontraumatic hematoma of soft tissue: Secondary | ICD-10-CM | POA: Diagnosis not present

## 2020-07-26 ENCOUNTER — Other Ambulatory Visit (HOSPITAL_COMMUNITY): Payer: Self-pay | Admitting: Family Medicine

## 2020-07-26 DIAGNOSIS — I8311 Varicose veins of right lower extremity with inflammation: Secondary | ICD-10-CM | POA: Diagnosis not present

## 2020-07-26 MED FILL — ELIQUIS STARTER PACK 5 MG T: 5 | 30 days supply | Qty: 74 | Fill #0

## 2020-07-28 ENCOUNTER — Telehealth: Payer: Self-pay | Admitting: Obstetrics & Gynecology

## 2020-08-01 NOTE — Telephone Encounter (Signed)
Called and left this pt a message as well.  Asked her send me a mychart message about her questions.  Thanks.

## 2020-08-02 DIAGNOSIS — I824Z1 Acute embolism and thrombosis of unspecified deep veins of right distal lower extremity: Secondary | ICD-10-CM | POA: Diagnosis not present

## 2020-08-05 ENCOUNTER — Encounter: Payer: Self-pay | Admitting: Obstetrics & Gynecology

## 2020-08-05 ENCOUNTER — Other Ambulatory Visit: Payer: Self-pay | Admitting: Obstetrics & Gynecology

## 2020-08-05 MED ORDER — GABAPENTIN 100 MG PO CAPS
ORAL_CAPSULE | ORAL | 1 refills | Status: DC
Start: 2020-08-05 — End: 2020-08-05

## 2020-08-05 MED ORDER — NONFORMULARY OR COMPOUNDED ITEM
3 refills | Status: DC
Start: 2020-08-05 — End: 2020-08-16

## 2020-08-05 MED FILL — GABAPENTIN 100 MG CAPSULE: 100 | 20 days supply | Qty: 60 | Fill #0

## 2020-08-16 ENCOUNTER — Other Ambulatory Visit: Payer: Self-pay | Admitting: Obstetrics & Gynecology

## 2020-08-16 MED ORDER — NONFORMULARY OR COMPOUNDED ITEM: 3 refills | Status: DC

## 2020-08-18 MED FILL — GABAPENTIN 100 MG CAPSULE: 100 | 20 days supply | Qty: 60 | Fill #0

## 2020-08-23 ENCOUNTER — Telehealth: Payer: Self-pay | Admitting: Family Medicine

## 2020-08-23 NOTE — Telephone Encounter (Signed)
ok 

## 2020-08-23 NOTE — Telephone Encounter (Signed)
Lvm to find out if pt would like to have referral sent back to [facility] [place]

## 2020-08-23 NOTE — Telephone Encounter (Signed)
Pt called and wants to see if she can get another referral for Pt she is having issues with her back now and the therapist said it was a muscle issue that has not been resolved so she would like to continue PT.

## 2020-08-24 MED FILL — ELIQUIS 5 MG TABLET: 5 | 30 days supply | Qty: 60 | Fill #0

## 2020-08-24 NOTE — Telephone Encounter (Signed)
Pt was advised [facility] 

## 2020-08-25 DIAGNOSIS — M25552 Pain in left hip: Secondary | ICD-10-CM | POA: Diagnosis not present

## 2020-08-25 DIAGNOSIS — M25551 Pain in right hip: Secondary | ICD-10-CM | POA: Diagnosis not present

## 2020-08-25 DIAGNOSIS — M545 Low back pain, unspecified: Secondary | ICD-10-CM | POA: Diagnosis not present

## 2020-08-25 DIAGNOSIS — M791 Myalgia, unspecified site: Secondary | ICD-10-CM | POA: Diagnosis not present

## 2020-08-30 DIAGNOSIS — I8312 Varicose veins of left lower extremity with inflammation: Secondary | ICD-10-CM | POA: Diagnosis not present

## 2020-08-31 DIAGNOSIS — I83811 Varicose veins of right lower extremities with pain: Secondary | ICD-10-CM | POA: Diagnosis not present

## 2020-08-31 DIAGNOSIS — I824Z1 Acute embolism and thrombosis of unspecified deep veins of right distal lower extremity: Secondary | ICD-10-CM | POA: Diagnosis not present

## 2020-08-31 DIAGNOSIS — I8311 Varicose veins of right lower extremity with inflammation: Secondary | ICD-10-CM | POA: Diagnosis not present

## 2020-09-02 DIAGNOSIS — M25551 Pain in right hip: Secondary | ICD-10-CM | POA: Diagnosis not present

## 2020-09-02 DIAGNOSIS — M545 Low back pain: Secondary | ICD-10-CM | POA: Diagnosis not present

## 2020-09-02 DIAGNOSIS — M791 Myalgia, unspecified site: Secondary | ICD-10-CM | POA: Diagnosis not present

## 2020-09-02 DIAGNOSIS — M25552 Pain in left hip: Secondary | ICD-10-CM | POA: Diagnosis not present

## 2020-09-07 DIAGNOSIS — M25552 Pain in left hip: Secondary | ICD-10-CM | POA: Diagnosis not present

## 2020-09-07 DIAGNOSIS — M25551 Pain in right hip: Secondary | ICD-10-CM | POA: Diagnosis not present

## 2020-09-07 DIAGNOSIS — M791 Myalgia, unspecified site: Secondary | ICD-10-CM | POA: Diagnosis not present

## 2020-09-07 DIAGNOSIS — M545 Low back pain: Secondary | ICD-10-CM | POA: Diagnosis not present

## 2020-09-15 DIAGNOSIS — M25552 Pain in left hip: Secondary | ICD-10-CM | POA: Diagnosis not present

## 2020-09-15 DIAGNOSIS — M545 Low back pain, unspecified: Secondary | ICD-10-CM | POA: Diagnosis not present

## 2020-09-15 DIAGNOSIS — M791 Myalgia, unspecified site: Secondary | ICD-10-CM | POA: Diagnosis not present

## 2020-09-15 DIAGNOSIS — M25551 Pain in right hip: Secondary | ICD-10-CM | POA: Diagnosis not present

## 2020-09-21 ENCOUNTER — Encounter (HOSPITAL_BASED_OUTPATIENT_CLINIC_OR_DEPARTMENT_OTHER): Payer: Self-pay

## 2020-09-22 ENCOUNTER — Encounter: Payer: Self-pay | Admitting: Obstetrics & Gynecology

## 2020-09-22 DIAGNOSIS — Z1231 Encounter for screening mammogram for malignant neoplasm of breast: Secondary | ICD-10-CM | POA: Diagnosis not present

## 2020-09-23 DIAGNOSIS — M25552 Pain in left hip: Secondary | ICD-10-CM | POA: Diagnosis not present

## 2020-09-23 DIAGNOSIS — M545 Low back pain, unspecified: Secondary | ICD-10-CM | POA: Diagnosis not present

## 2020-09-23 DIAGNOSIS — M791 Myalgia, unspecified site: Secondary | ICD-10-CM | POA: Diagnosis not present

## 2020-09-23 DIAGNOSIS — M25551 Pain in right hip: Secondary | ICD-10-CM | POA: Diagnosis not present

## 2020-09-27 ENCOUNTER — Encounter: Payer: Self-pay | Admitting: Obstetrics & Gynecology

## 2020-09-27 DIAGNOSIS — R928 Other abnormal and inconclusive findings on diagnostic imaging of breast: Secondary | ICD-10-CM | POA: Diagnosis not present

## 2020-09-27 DIAGNOSIS — R922 Inconclusive mammogram: Secondary | ICD-10-CM | POA: Diagnosis not present

## 2020-09-27 DIAGNOSIS — N6001 Solitary cyst of right breast: Secondary | ICD-10-CM | POA: Diagnosis not present

## 2020-09-29 DIAGNOSIS — M25551 Pain in right hip: Secondary | ICD-10-CM | POA: Diagnosis not present

## 2020-09-29 DIAGNOSIS — M545 Low back pain: Secondary | ICD-10-CM | POA: Diagnosis not present

## 2020-09-29 DIAGNOSIS — M791 Myalgia, unspecified site: Secondary | ICD-10-CM | POA: Diagnosis not present

## 2020-09-29 DIAGNOSIS — M25552 Pain in left hip: Secondary | ICD-10-CM | POA: Diagnosis not present

## 2020-10-18 DIAGNOSIS — M545 Low back pain, unspecified: Secondary | ICD-10-CM | POA: Diagnosis not present

## 2020-10-18 DIAGNOSIS — M791 Myalgia, unspecified site: Secondary | ICD-10-CM | POA: Diagnosis not present

## 2020-10-18 DIAGNOSIS — M25552 Pain in left hip: Secondary | ICD-10-CM | POA: Diagnosis not present

## 2020-10-18 DIAGNOSIS — M25551 Pain in right hip: Secondary | ICD-10-CM | POA: Diagnosis not present

## 2020-10-27 DIAGNOSIS — M545 Low back pain, unspecified: Secondary | ICD-10-CM | POA: Diagnosis not present

## 2020-10-27 DIAGNOSIS — M791 Myalgia, unspecified site: Secondary | ICD-10-CM | POA: Diagnosis not present

## 2020-10-27 DIAGNOSIS — M25552 Pain in left hip: Secondary | ICD-10-CM | POA: Diagnosis not present

## 2020-10-27 DIAGNOSIS — M25551 Pain in right hip: Secondary | ICD-10-CM | POA: Diagnosis not present

## 2020-11-02 DIAGNOSIS — M25551 Pain in right hip: Secondary | ICD-10-CM | POA: Diagnosis not present

## 2020-11-02 DIAGNOSIS — M25552 Pain in left hip: Secondary | ICD-10-CM | POA: Diagnosis not present

## 2020-11-02 DIAGNOSIS — M545 Low back pain, unspecified: Secondary | ICD-10-CM | POA: Diagnosis not present

## 2020-11-02 DIAGNOSIS — M791 Myalgia, unspecified site: Secondary | ICD-10-CM | POA: Diagnosis not present

## 2020-11-07 DIAGNOSIS — L821 Other seborrheic keratosis: Secondary | ICD-10-CM | POA: Diagnosis not present

## 2020-11-07 DIAGNOSIS — Z85828 Personal history of other malignant neoplasm of skin: Secondary | ICD-10-CM | POA: Diagnosis not present

## 2020-11-07 DIAGNOSIS — L570 Actinic keratosis: Secondary | ICD-10-CM | POA: Diagnosis not present

## 2020-11-07 DIAGNOSIS — L819 Disorder of pigmentation, unspecified: Secondary | ICD-10-CM | POA: Diagnosis not present

## 2020-11-07 DIAGNOSIS — L720 Epidermal cyst: Secondary | ICD-10-CM | POA: Diagnosis not present

## 2020-11-07 DIAGNOSIS — L820 Inflamed seborrheic keratosis: Secondary | ICD-10-CM | POA: Diagnosis not present

## 2020-11-07 DIAGNOSIS — L814 Other melanin hyperpigmentation: Secondary | ICD-10-CM | POA: Diagnosis not present

## 2020-11-09 DIAGNOSIS — M25552 Pain in left hip: Secondary | ICD-10-CM | POA: Diagnosis not present

## 2020-11-09 DIAGNOSIS — M25551 Pain in right hip: Secondary | ICD-10-CM | POA: Diagnosis not present

## 2020-11-09 DIAGNOSIS — M791 Myalgia, unspecified site: Secondary | ICD-10-CM | POA: Diagnosis not present

## 2020-11-09 DIAGNOSIS — M545 Low back pain, unspecified: Secondary | ICD-10-CM | POA: Diagnosis not present

## 2020-11-18 DIAGNOSIS — M791 Myalgia, unspecified site: Secondary | ICD-10-CM | POA: Diagnosis not present

## 2020-11-18 DIAGNOSIS — M545 Low back pain: Secondary | ICD-10-CM | POA: Diagnosis not present

## 2020-11-18 DIAGNOSIS — M25552 Pain in left hip: Secondary | ICD-10-CM | POA: Diagnosis not present

## 2020-11-18 DIAGNOSIS — M25551 Pain in right hip: Secondary | ICD-10-CM | POA: Diagnosis not present

## 2020-11-22 ENCOUNTER — Other Ambulatory Visit: Payer: Self-pay | Admitting: Obstetrics & Gynecology

## 2020-11-22 ENCOUNTER — Ambulatory Visit: Payer: PPO | Attending: Internal Medicine

## 2020-11-22 ENCOUNTER — Ambulatory Visit: Payer: PPO

## 2020-11-22 ENCOUNTER — Other Ambulatory Visit (HOSPITAL_BASED_OUTPATIENT_CLINIC_OR_DEPARTMENT_OTHER): Payer: Self-pay

## 2020-11-22 ENCOUNTER — Other Ambulatory Visit: Payer: Self-pay

## 2020-11-22 DIAGNOSIS — Z23 Encounter for immunization: Secondary | ICD-10-CM

## 2020-11-22 DIAGNOSIS — Z01419 Encounter for gynecological examination (general) (routine) without abnormal findings: Secondary | ICD-10-CM

## 2020-11-22 MED ORDER — PFIZER-BIONT COVID-19 VAC-TRIS 30 MCG/0.3ML IM SUSP
INTRAMUSCULAR | 0 refills | Status: DC
  Filled 2020-11-22: qty 0.3, 1d supply, fill #0

## 2020-11-22 NOTE — Progress Notes (Signed)
   Covid-19 Vaccination Clinic  Name:  Alicia Alvarez    MRN: [id number] DOB: Dec 21, 1950  11/22/2020  [name] was observed post Covid-19 immunization for 15 minutes without incident. She was provided with Vaccine Information Sheet and instruction to access the V-Safe system.   [name] was instructed to call 911 with any severe reactions post vaccine: Marland Kitchen Difficulty breathing  . Swelling of face and throat  . A fast heartbeat  . A bad rash all over body  . Dizziness and weakness   Immunizations Administered    Name Date Dose VIS Date Route   PFIZER Comrnaty(Gray TOP) Covid-19 Vaccine 11/22/2020  2:28 PM 0.3 mL 07/07/2020 Intramuscular   Manufacturer: [place]   Lot: [id number]   NDC: [phone]

## 2020-11-23 MED ORDER — ZOLPIDEM TARTRATE 5 MG PO TABS
ORAL_TABLET | ORAL | 0 refills | Status: DC
  Filled 2020-11-23: qty 30, 30d supply, fill #0

## 2020-11-24 ENCOUNTER — Other Ambulatory Visit (HOSPITAL_COMMUNITY): Payer: Self-pay

## 2020-11-25 ENCOUNTER — Other Ambulatory Visit (HOSPITAL_BASED_OUTPATIENT_CLINIC_OR_DEPARTMENT_OTHER): Payer: Self-pay

## 2021-01-17 ENCOUNTER — Ambulatory Visit (INDEPENDENT_AMBULATORY_CARE_PROVIDER_SITE_OTHER): Payer: PPO | Admitting: Medical

## 2021-01-17 ENCOUNTER — Encounter: Payer: Self-pay | Admitting: Medical

## 2021-01-17 ENCOUNTER — Other Ambulatory Visit: Payer: Self-pay

## 2021-01-17 ENCOUNTER — Other Ambulatory Visit (HOSPITAL_COMMUNITY): Payer: Self-pay

## 2021-01-17 ENCOUNTER — Ambulatory Visit: Payer: PPO | Admitting: Medical

## 2021-01-17 VITALS — BP 140/84 | HR 75 | Temp 97.8°F | Ht 67.0 in | Wt 140.0 lb

## 2021-01-17 DIAGNOSIS — Z86718 Personal history of other venous thrombosis and embolism: Secondary | ICD-10-CM | POA: Diagnosis not present

## 2021-01-17 DIAGNOSIS — R0789 Other chest pain: Secondary | ICD-10-CM | POA: Diagnosis not present

## 2021-01-17 DIAGNOSIS — R059 Cough, unspecified: Secondary | ICD-10-CM | POA: Insufficient documentation

## 2021-01-17 DIAGNOSIS — J0110 Acute frontal sinusitis, unspecified: Secondary | ICD-10-CM | POA: Diagnosis not present

## 2021-01-17 MED ORDER — AZITHROMYCIN 250 MG PO TABS
ORAL_TABLET | ORAL | 0 refills | Status: DC
  Filled 2021-01-17: qty 6, 5d supply, fill #0

## 2021-01-17 NOTE — Progress Notes (Signed)
Subjective:  Alicia Alvarez is a 70 y.o. female who presents for Chief Complaint  Patient presents with   Nasal Congestion    Sinus pressure and cough and chest congestion, sob for 10 days. Covid negative      Here for respiratory tract infection.  She notes PCR COVID test negative yesterday after having 3 also negative home rapid test.  She notes 10-day history of sinus pressure, congestion, cough and the sinus mucus and productive cough is now with green sputum whereas it was clear before.  She has not had a fever.  What is different this time with this particular respiratory tract infection is that she feels a little tight in the chest little short of breath.  She denies nausea or vomiting.  She had a sore throat initially.  No sick contacts.  She has been using Mucinex.  She has no history of lung disease, no history of asthma, non-smoker.  The chart notes prior Eliquis use.  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  She was on this for several months but is not currently on Eliquis.  No other history of blood clots.  She did travel to [place] by plane about a month ago.  She denies any calf pain or swelling.  She is back on hormone therapy.  She did use compression stockings while on her trip to [place]  and she was active and moving around even on the plane.  No other aggravating or relieving factors.    No other c/o.  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  ROS Otherwise as in subjective above    Objective: BP 140/84   Pulse 75   Temp 97.8 F (36.6 C)   Ht 5\' 7"   (1.702 m)   Wt 140 lb (63.5 kg)   LMP 07/30/2002   SpO2 98%   BMI 21.93 kg/m   General appearance: alert, no distress, well developed, well nourished HEENT: normocephalic, sclerae anicteric, conjunctiva pink and moist, TMs flat, no erythema, nares patent, no discharge , +erythema, pharynx normal Oral cavity: MMM, no lesions Neck: supple, no lymphadenopathy, no thyromegaly, no masses Heart: RRR, normal S1, S2, no murmurs Lungs: coarse sounds, no wheezes, rhonchi, or rales Pulses: 2+ radial pulses, 2+ pedal pulses, normal cap refill Ext: no edema, no calf pain or swelling, negative Homans    Assessment: Encounter Diagnoses  Name Primary?   Cough Yes   Chest tightness    Acute non-recurrent frontal sinusitis    History of DVT (deep vein thrombosis)      Plan: We discussed symptoms and exam findings.  Gave the option of chest x-ray.  Symptoms and exam suggest sinobronchitis.  She may hold off for now.  Begin medication below, continue good hydration and rest.  Continue Mucinex over-the-counter.  If not much improved within the next 3 to 5 days call or recheck  History of DVT in November 2021, not currently on Eliquis.  This was isolated event related to the procedure she had for sclerotherapy in her legs.  Alicia Alvarez was seen today  for nasal congestion.  Diagnoses and all orders for this visit:  Cough -     DG Chest 2 View; Future  Chest tightness -     DG Chest 2 View; Future  Acute non-recurrent frontal sinusitis  History of DVT (deep vein thrombosis)  Other orders -     azithromycin (ZITHROMAX) 250 MG tablet; Take 2 tablets by mouth now, then take 1 tablet by mouth daily until gone   Follow up: if not much improved within next 3-5 days

## 2021-01-24 ENCOUNTER — Telehealth (INDEPENDENT_AMBULATORY_CARE_PROVIDER_SITE_OTHER): Payer: PPO | Admitting: Family Medicine

## 2021-01-24 ENCOUNTER — Other Ambulatory Visit (HOSPITAL_COMMUNITY): Payer: Self-pay

## 2021-01-24 ENCOUNTER — Encounter: Payer: Self-pay | Admitting: Family Medicine

## 2021-01-24 VITALS — Temp 96.7°F | Wt 140.0 lb

## 2021-01-24 DIAGNOSIS — J0110 Acute frontal sinusitis, unspecified: Secondary | ICD-10-CM | POA: Diagnosis not present

## 2021-01-24 MED ORDER — AZITHROMYCIN 500 MG PO TABS
500.0000 mg | ORAL_TABLET | Freq: Every day | ORAL | 0 refills | Status: DC
  Filled 2021-01-24: qty 3, 3d supply, fill #0

## 2021-01-24 NOTE — Progress Notes (Signed)
   Subjective:    Patient ID: Alicia Alvarez, female    DOB: 05/17/1951, 70 y.o.   MRN: [id number]  HPI Documentation for virtual audio The patient was located at home. 2 patient identifiers used.  Unable to do video The provider was located in the office. The patient did consent to this visit and is aware of possible charges through their insurance for this visit. The other persons participating in this telemedicine service were none. Time spent on call was 5 minutes and in review of previous records >20 minutes total for counseling and coordination of care. This virtual service is not related to other E/M service within previous 7 days.  She was seen June 21 by Audelia Acton and put on a Z-Pak.  She states that she is still having a slight sore throat, headache and retro-orbital pressure and fatigue but overall feels roughly 75% better.  Review of Systems     Objective:   Physical Exam Alert and in no distress.  Normal voice noted.       Assessment & Plan:  Acute non-recurrent frontal sinusitis - Plan: azithromycin (ZITHROMAX) 500 MG tablet I stated that since she is roughly 75% better another round of azithromycin should be quite useful.  She was comfortable with that.

## 2021-02-16 ENCOUNTER — Other Ambulatory Visit (HOSPITAL_COMMUNITY): Payer: Self-pay

## 2021-02-16 ENCOUNTER — Other Ambulatory Visit: Payer: Self-pay

## 2021-02-16 ENCOUNTER — Ambulatory Visit (INDEPENDENT_AMBULATORY_CARE_PROVIDER_SITE_OTHER): Payer: PPO | Admitting: Family Medicine

## 2021-02-16 VITALS — BP 116/72 | HR 60 | Temp 97.4°F | Wt 150.0 lb

## 2021-02-16 DIAGNOSIS — L237 Allergic contact dermatitis due to plants, except food: Secondary | ICD-10-CM

## 2021-02-16 MED ORDER — PREDNISONE 10 MG (21) PO TBPK
ORAL_TABLET | ORAL | 0 refills | Status: DC
Start: 2021-02-16 — End: 2021-09-06
  Filled 2021-02-16: qty 21, 6d supply, fill #0

## 2021-02-16 MED ORDER — METHYLPREDNISOLONE SODIUM SUCC 125 MG IJ SOLR: 125.0000 mg | Freq: Once | INTRAMUSCULAR | Status: DC

## 2021-02-16 NOTE — Progress Notes (Signed)
   Subjective:    Patient ID: Alicia Alvarez, female    DOB: 1951/06/27, 70 y.o.   MRN: [id number]  HPI She is here for consult concerning poison oak.  She states that she is very allergic to it and has had several lesions in the last several days and is concerned about the fact that they can get worse.   Review of Systems     Objective:   Physical Exam Alert and in no distress.  She has 3 relatively small erythematous lesions on her arms.       Assessment & Plan:  Allergic contact dermatitis due to plants, except food - Plan: predniSONE (STERAPRED UNI-PAK 21 TAB) 10 MG (21) TBPK tablet, DISCONTINUED: methylPREDNISolone sodium succinate (SOLU-MEDROL) 125 mg/2 mL injection 125 mg She was given a Sterapred Dosepak and plans to fill it but recommend she hold off for several days to ensure that she truly needs it as at this point I think would be overtreating take to use it.  She was comfortable with that.  Recommend cool compresses, Benadryl at night and cortisone cream

## 2021-03-09 ENCOUNTER — Ambulatory Visit: Payer: PPO

## 2021-03-20 ENCOUNTER — Other Ambulatory Visit: Payer: Self-pay

## 2021-03-20 ENCOUNTER — Other Ambulatory Visit (HOSPITAL_COMMUNITY)
Admission: RE | Admit: 2021-03-20 | Discharge: 2021-03-20 | Disposition: A | Discharge status: Home / Self Care | Payer: PPO | Source: Ambulatory Visit | Attending: Obstetrics & Gynecology | Admitting: Obstetrics & Gynecology

## 2021-03-20 ENCOUNTER — Encounter (HOSPITAL_BASED_OUTPATIENT_CLINIC_OR_DEPARTMENT_OTHER): Payer: Self-pay | Admitting: Obstetrics & Gynecology

## 2021-03-20 ENCOUNTER — Ambulatory Visit (INDEPENDENT_AMBULATORY_CARE_PROVIDER_SITE_OTHER): Payer: PPO | Admitting: Obstetrics & Gynecology

## 2021-03-20 ENCOUNTER — Other Ambulatory Visit (HOSPITAL_COMMUNITY): Payer: Self-pay

## 2021-03-20 VITALS — BP 120/80 | HR 65 | Ht 66.5 in | Wt 150.6 lb

## 2021-03-20 DIAGNOSIS — Z86718 Personal history of other venous thrombosis and embolism: Secondary | ICD-10-CM | POA: Diagnosis not present

## 2021-03-20 DIAGNOSIS — Z780 Asymptomatic menopausal state: Secondary | ICD-10-CM | POA: Diagnosis not present

## 2021-03-20 DIAGNOSIS — L299 Pruritus, unspecified: Secondary | ICD-10-CM

## 2021-03-20 DIAGNOSIS — N898 Other specified noninflammatory disorders of vagina: Secondary | ICD-10-CM | POA: Diagnosis not present

## 2021-03-20 DIAGNOSIS — G4700 Insomnia, unspecified: Secondary | ICD-10-CM | POA: Diagnosis not present

## 2021-03-20 DIAGNOSIS — Z01411 Encounter for gynecological examination (general) (routine) with abnormal findings: Secondary | ICD-10-CM

## 2021-03-20 DIAGNOSIS — Z01419 Encounter for gynecological examination (general) (routine) without abnormal findings: Secondary | ICD-10-CM

## 2021-03-20 MED ORDER — TRIAMCINOLONE ACETONIDE 0.1 % EX CREA
TOPICAL_CREAM | Freq: Two times a day (BID) | CUTANEOUS | 1 refills | Status: DC
  Filled 2021-03-20: qty 30, 15d supply, fill #0

## 2021-03-20 MED ORDER — ZOLPIDEM TARTRATE 5 MG PO TABS
5.0000 mg | ORAL_TABLET | Freq: Every evening | ORAL | 1 refills | Status: DC | PRN
  Filled 2021-03-20: qty 30, 30d supply, fill #0

## 2021-03-20 NOTE — Progress Notes (Signed)
70 y.o. [id number] Divorced White or Caucasian female here for annual exam.  I am also following her for h/o DVT while on HRT.  She's off now.  Does have some clear discharge.  Is using vit vaginal suppositories but only one week.  Uses Ambien on occasion.  Uses it when traveling.  Needs RF.  Never requests additional    Still having hot flashes.  Never tried gabapentin.  We discussed this again today.  She understood this would take months before knowing how well would help.  Discussed she will know within a few days how well it will help and also how to titrate up medication.  Denies vaginal bleeding.  Reports she is having some vaginal discharge and would like evaluation.    Requests refill for triamcinolone.  Uses topically prn itching.  Patient's last menstrual period was 07/30/2002.          Sexually active: up until a month ago H/O STD:  no  Health Maintenance: PCP:  Dr. Redmond School.  Last wellness appt was 2020.  Has no upcoming appts scheduled. Vaccines are up to date:  tetanus is due.  Pt aware.  Pneumonia vaccination discussed.   Colonoscopy:  declines.  Declines cologuard. MMG:  08/2020 BMD:  declines this year Last pap smear:  12/2019.   H/o abnormal pap smear:  no   reports that she has quit smoking. Her smoking use included cigarettes. She has a 1.50 pack-year smoking history. She has never used smokeless tobacco. She reports current alcohol use of about 2.0 - 3.0 standard drinks per week. She reports that she does not use drugs.  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   Hemorrhoids    external    Past Surgical History:  Procedure Laterality Date   adnoidectomy     as child   [place]   x [place] N/A 10/10/2015   Procedure: DILATATION & CURETTAGE/HYSTEROSCOPY WITH MYOSURE;  Surgeon:  Megan Salon, MD;  Location: [facility];  Service: Gynecology;  Laterality: N/A;   DILATION AND CURETTAGE OF UTERUS     MAB in 1993 or 1994   SKIN BIOPSY Left 09/30/2019   verruca vulgaris, irritated   SKIN BIOPSY Right 02/26/2020   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  One ml pv two to three times weekly.  Disp: 3 month supply. 36 each 3   zolpidem (AMBIEN) 5 MG tablet TAKE 1 TABLET (5 MG TOTAL) BY MOUTH AT BEDTIME AS NEEDED FOR SLEEP. 30 tablet 0   COVID-19 mRNA Vac-TriS, Pfizer, (PFIZER-BIONT COVID-19 VAC-TRIS) SUSP injection Inject into the muscle. (Patient not taking: No sig reported) 0.3 mL 0   predniSONE (STERAPRED UNI-PAK 21 TAB) 10 MG (21) TBPK tablet Follow enclosed instrictions (Patient not taking: Reported on 03/20/2021) 21 tablet 0   triamcinolone cream (KENALOG) 0.1 %  (Patient not taking: Reported on 03/20/2021)     No current facility-administered medications for this visit.    Family History  Problem Relation Age of Onset   Prostate cancer Father     Review  of Systems  All other systems reviewed and are negative.  Exam:   BP 120/80 (BP Location: Right Arm, Patient Position: Sitting, Cuff Size: Small)   Pulse 65   Ht 5' 6.5" (1.689 m)   Wt 150 lb 9.6 oz (68.3 kg)   LMP 07/30/2002   BMI 23.94 kg/m   Height: 5' 6.5" (168.9 cm)  General appearance: alert, cooperative and appears stated age Breasts: normal appearance, no masses or tenderness Abdomen: soft, non-tender; bowel sounds normal; no masses,  no organomegaly Lymph nodes: Cervical, supraclavicular, and axillary nodes normal.  No abnormal inguinal nodes palpated Neurologic: Grossly normal  Pelvic: External genitalia:  no lesions              Urethra:  normal appearing urethra with no  masses, tenderness or lesions              Bartholins and Skenes: normal                 Vagina: normal appearing vagina with watery discharge noted, no lesions              Cervix: no lesions              Pap taken: No. Bimanual Exam:  Uterus:  normal size, contour, position, consistency, mobility, non-tender              Adnexa: normal adnexa and no mass, fullness, tenderness               Rectovaginal: Confirms               Anus:  normal sphincter tone, no lesions  Chaperone, Octaviano Batty, CMA, was present for exam.  Assessment/Plan: 1. Encntr for gyn exam (general) (routine) w abnormal findings - pap normal 12/2019.  Not indicated today. - MMG 08/2020 - Pt declines BMD for now - declines colonoscopy and cologuard for colon cancer screening - vaccines reviewed.  Pt aware Tdap is due.  2. Vaginal discharge - Cervicovaginal ancillary only( [place])  3. History of DVT (deep vein thrombosis)  4. Insomnia, unspecified type - zolpidem (AMBIEN) 5 MG tablet; Take 1 tablet (5 mg total) by mouth at bedtime as needed for sleep.  Dispense: 30 tablet; Refill: 1  5. Postmenopausal - pt has gabapentin and is going to try this.  Instructions given.  6. Itching - triamcinolone cream (KENALOG) 0.1 %; Apply topically 2 (two) times daily. Do not use for more then 5 days at a time.  Dispense: 30 g; Refill: 1

## 2021-03-21 ENCOUNTER — Telehealth (HOSPITAL_BASED_OUTPATIENT_CLINIC_OR_DEPARTMENT_OTHER): Payer: Self-pay | Admitting: Obstetrics & Gynecology

## 2021-03-21 LAB — CERVICOVAGINAL ANCILLARY ONLY
Bacterial Vaginitis (gardnerella): POSITIVE — AB
Candida Glabrata: NEGATIVE
Candida Vaginitis: NEGATIVE
Comment: NEGATIVE
Comment: NEGATIVE
Comment: NEGATIVE

## 2021-03-21 NOTE — Telephone Encounter (Signed)
Dr.Miller patient called and would for you give her a call about results/medications .I inform the patient that you wasn't here today .

## 2021-03-22 ENCOUNTER — Other Ambulatory Visit (HOSPITAL_COMMUNITY): Payer: Self-pay

## 2021-03-22 ENCOUNTER — Encounter (HOSPITAL_BASED_OUTPATIENT_CLINIC_OR_DEPARTMENT_OTHER): Payer: Self-pay

## 2021-03-22 ENCOUNTER — Other Ambulatory Visit (HOSPITAL_BASED_OUTPATIENT_CLINIC_OR_DEPARTMENT_OTHER): Payer: Self-pay | Admitting: *Deleted

## 2021-03-22 MED ORDER — METRONIDAZOLE 0.75 % VA GEL
1.0000 | Freq: Every day | VAGINAL | 0 refills | Status: AC
  Filled 2021-03-22: qty 70, 5d supply, fill #0
  Filled 2021-03-23: qty 70, 7d supply, fill #0

## 2021-03-23 ENCOUNTER — Other Ambulatory Visit (HOSPITAL_COMMUNITY): Payer: Self-pay

## 2021-03-23 DIAGNOSIS — G4700 Insomnia, unspecified: Secondary | ICD-10-CM | POA: Insufficient documentation

## 2021-03-23 DIAGNOSIS — Z780 Asymptomatic menopausal state: Secondary | ICD-10-CM | POA: Insufficient documentation

## 2021-03-28 DIAGNOSIS — L570 Actinic keratosis: Secondary | ICD-10-CM | POA: Diagnosis not present

## 2021-03-28 DIAGNOSIS — Z85828 Personal history of other malignant neoplasm of skin: Secondary | ICD-10-CM | POA: Diagnosis not present

## 2021-03-28 DIAGNOSIS — L820 Inflamed seborrheic keratosis: Secondary | ICD-10-CM | POA: Diagnosis not present

## 2021-04-09 ENCOUNTER — Encounter (HOSPITAL_BASED_OUTPATIENT_CLINIC_OR_DEPARTMENT_OTHER): Payer: Self-pay

## 2021-04-19 DIAGNOSIS — F4323 Adjustment disorder with mixed anxiety and depressed mood: Secondary | ICD-10-CM | POA: Diagnosis not present

## 2021-04-25 ENCOUNTER — Telehealth (HOSPITAL_BASED_OUTPATIENT_CLINIC_OR_DEPARTMENT_OTHER): Payer: Self-pay

## 2021-04-25 NOTE — Telephone Encounter (Signed)
Patient called this morning wanting to know if she should come in for an appointment or if we could just simply call her in a Rx for BV. Patient states that this has been an on-going issue for her.   I tried contacting patient without success. Voicemail is full. I wanted to encourage patient to come by the office to do a self-swab so that we would know what we are treating. I will try calling patient again later. tbw

## 2021-04-26 NOTE — Telephone Encounter (Signed)
Tried calling patient to see if she could come in for self swab. Patient was unavailable and voicemail is full. Will try again later. tbw

## 2021-04-27 ENCOUNTER — Ambulatory Visit: Payer: PPO | Attending: Internal Medicine

## 2021-04-27 ENCOUNTER — Other Ambulatory Visit (HOSPITAL_COMMUNITY)
Admission: RE | Admit: 2021-04-27 | Discharge: 2021-04-27 | Disposition: A | Discharge status: Home / Self Care | Payer: PPO | Source: Ambulatory Visit | Attending: Obstetrics & Gynecology | Admitting: Obstetrics & Gynecology

## 2021-04-27 ENCOUNTER — Other Ambulatory Visit (HOSPITAL_BASED_OUTPATIENT_CLINIC_OR_DEPARTMENT_OTHER): Payer: Self-pay

## 2021-04-27 ENCOUNTER — Other Ambulatory Visit: Payer: Self-pay

## 2021-04-27 ENCOUNTER — Ambulatory Visit (INDEPENDENT_AMBULATORY_CARE_PROVIDER_SITE_OTHER): Payer: PPO | Admitting: *Deleted

## 2021-04-27 DIAGNOSIS — N898 Other specified noninflammatory disorders of vagina: Secondary | ICD-10-CM

## 2021-04-27 DIAGNOSIS — Z23 Encounter for immunization: Secondary | ICD-10-CM

## 2021-04-27 MED ORDER — PFIZER COVID-19 VAC BIVALENT 30 MCG/0.3ML IM SUSP
INTRAMUSCULAR | 0 refills | Status: DC
  Filled 2021-04-27: qty 0.3, 1d supply, fill #0

## 2021-04-27 NOTE — Telephone Encounter (Signed)
Patient call back and after reading charts  notes I scheduled patient to come in to do a self swab .

## 2021-04-27 NOTE — Progress Notes (Signed)
Pt complains of vaginal discharge despite recent treatment for BV. Pt performed self-swab and order placed for testing. Advised we would let her know results and send in any recommended treatment once test has resulted. Pt verbalized understanding.

## 2021-04-27 NOTE — Progress Notes (Signed)
   Covid-19 Vaccination Clinic  Name:  Alicia Alvarez    MRN: [id number] DOB: Dec 07, 1950  04/27/2021  Alicia Alvarez was observed post Covid-19 immunization for 15 minutes without incident. She was provided with Vaccine Information Sheet and instruction to access the V-Safe system.   Alicia Alvarez was instructed to call 911 with any severe reactions post vaccine: Difficulty breathing  Swelling of face and throat  A fast heartbeat  A bad rash all over body  Dizziness and weakness

## 2021-05-01 LAB — CERVICOVAGINAL ANCILLARY ONLY
Bacterial Vaginitis (gardnerella): POSITIVE — AB
Candida Glabrata: NEGATIVE
Candida Vaginitis: NEGATIVE
Comment: NEGATIVE
Comment: NEGATIVE
Comment: NEGATIVE

## 2021-05-02 ENCOUNTER — Other Ambulatory Visit (HOSPITAL_COMMUNITY): Payer: Self-pay

## 2021-05-02 MED ORDER — CLINDAMYCIN PHOSPHATE 2 % VA CREA
1.0000 | TOPICAL_CREAM | Freq: Every day | VAGINAL | 0 refills | Status: DC
  Filled 2021-05-02: qty 40, 7d supply, fill #0

## 2021-05-02 NOTE — Addendum Note (Signed)
Addended by: Megan Salon on: 05/02/2021 01:49 PM   Modules accepted: Orders

## 2021-05-10 DIAGNOSIS — F4323 Adjustment disorder with mixed anxiety and depressed mood: Secondary | ICD-10-CM | POA: Diagnosis not present

## 2021-05-31 DIAGNOSIS — F4323 Adjustment disorder with mixed anxiety and depressed mood: Secondary | ICD-10-CM | POA: Diagnosis not present

## 2021-06-07 ENCOUNTER — Telehealth: Payer: Self-pay | Admitting: Family Medicine

## 2021-06-07 NOTE — Telephone Encounter (Signed)
Left message for patient to call back and schedule Medicare Annual Wellness Visit (AWV) either virtually or in office. I left my number for patient to call [phone].  awvi 10/28/17 per [facility]  please schedule at anytime with health coach  This should be a 45 minute visit.

## 2021-07-05 ENCOUNTER — Telehealth: Payer: Self-pay | Admitting: Family Medicine

## 2021-07-05 DIAGNOSIS — F4323 Adjustment disorder with mixed anxiety and depressed mood: Secondary | ICD-10-CM | POA: Diagnosis not present

## 2021-07-05 NOTE — Telephone Encounter (Signed)
Left message for patient to call back and schedule Medicare Annual Wellness Visit (AWV) either virtually or in office. I left my number for patient to call [phone].   awvi 10/28/17 per [facility]  please schedule at anytime with health coach  This should be a 45 minute visit.

## 2021-08-07 ENCOUNTER — Other Ambulatory Visit (HOSPITAL_BASED_OUTPATIENT_CLINIC_OR_DEPARTMENT_OTHER): Payer: Self-pay

## 2021-08-07 ENCOUNTER — Other Ambulatory Visit (HOSPITAL_COMMUNITY): Payer: Self-pay

## 2021-08-07 ENCOUNTER — Other Ambulatory Visit (HOSPITAL_BASED_OUTPATIENT_CLINIC_OR_DEPARTMENT_OTHER): Payer: Self-pay | Admitting: Obstetrics & Gynecology

## 2021-08-07 DIAGNOSIS — G4700 Insomnia, unspecified: Secondary | ICD-10-CM

## 2021-08-07 MED ORDER — ZOLPIDEM TARTRATE 5 MG PO TABS
5.0000 mg | ORAL_TABLET | Freq: Every evening | ORAL | 0 refills | Status: DC | PRN
  Filled 2021-08-07: qty 30, 30d supply, fill #0

## 2021-08-08 ENCOUNTER — Other Ambulatory Visit (HOSPITAL_COMMUNITY): Payer: Self-pay

## 2021-08-09 ENCOUNTER — Telehealth: Payer: Self-pay | Admitting: Family Medicine

## 2021-08-09 NOTE — Telephone Encounter (Signed)
Left message for patient to call back and schedule Medicare Annual Wellness Visit (AWV) either virtually or in office. I left my number for patient to call [phone].  awvi 10/28/17 per [facility]  please schedule at anytime with health coach  This should be a 45 minute visit.

## 2021-08-11 DIAGNOSIS — F4323 Adjustment disorder with mixed anxiety and depressed mood: Secondary | ICD-10-CM | POA: Diagnosis not present

## 2021-08-15 ENCOUNTER — Encounter: Payer: Self-pay | Admitting: Family Medicine

## 2021-09-06 ENCOUNTER — Other Ambulatory Visit: Payer: Self-pay

## 2021-09-06 ENCOUNTER — Ambulatory Visit (INDEPENDENT_AMBULATORY_CARE_PROVIDER_SITE_OTHER): Payer: PPO | Admitting: Medical

## 2021-09-06 VITALS — BP 110/70 | HR 84 | Temp 97.6°F

## 2021-09-06 DIAGNOSIS — R42 Dizziness and giddiness: Secondary | ICD-10-CM | POA: Diagnosis not present

## 2021-09-06 DIAGNOSIS — H9313 Tinnitus, bilateral: Secondary | ICD-10-CM

## 2021-09-06 NOTE — Progress Notes (Signed)
Subjective:  Alicia Alvarez is a 71 y.o. female who presents for Chief Complaint  Patient presents with   Tinnitus    Ringing in ears, been going on a month or two. Does a lot of swimming through out the week. Has a cough and been sneezing      Here for c/o ringing in ears for 1-2 months.   Yesterday noticed this in an office meeting.  At home can hear a low hum.   Others visiting her house do not hear this.   She notes there is a lot of road noise close to her home and has been a lot of noise from road construction close to her home in her neighborhood.  Sometimes ringing moves from ear to ear.  Yesterday some discomfort in left era.    Is a regular swimmer, wears ear plugs swimming.  Also wears ear plugs at night sleeping due to road noise.    No gun or other loud noise exposure up close  Maybe some occasionally runny nose but no regular illness or allergy symptoms.     Occasionally can feel dizzy.    Otherwise been in normal state of health.   No other aggravating or relieving factors.    No other c/o.  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 (Patient not taking: Reported on 09/06/2021) 30 tablet 0   No current facility-administered medications on file prior to visit.    The following portions of the patient's history were reviewed and updated as appropriate: allergies, current medications, past family history, past medical history, past social history, past surgical history and problem list.  ROS Otherwise as in subjective above    Objective: BP 110/70    Pulse 84    Temp 97.6 F (36.4 C)    LMP 07/30/2002   General  appearance: alert, no distress, well developed, well nourished HEENT: normocephalic, sclerae anicteric, conjunctiva pink and moist, TMs flat with reduced light reflex, otherwise TMs and canals normal,  nares patent, no discharge or erythema, pharynx normal Oral cavity: MMM, no lesions Neck: supple, no lymphadenopathy, no thyromegaly, no masses    Assessment: Encounter Diagnoses  Name Primary?   Tinnitus, bilateral Yes   Dizziness      Plan: Hearing test normal today.  We discussed symptoms and concerns and possible differential.  I suspect some eustachian tube inner ear issues.  With a normal hearing screen and no infection on exam I do not suspect Mnire's disease or ear infection.  No other neuro symptoms to suggest anything else neuro related  Hydrate well, begin Sudafed once or twice daily for the next 5 days.  Can either use separately or simultaneously add Flonase nasal spray in the morning.  We also discussed possible use of Afrin at bedtime for 3 days or less  If not much improved within the next week then we can add steroid and antibiotic.  She will call back if not improving to add these on  Ultimately if not improving will refer to ENT  Brittanie was seen today for tinnitus.  Diagnoses and all orders for this visit:  Tinnitus, bilateral  Dizziness   Follow up: pending call back

## 2021-09-12 ENCOUNTER — Telehealth: Payer: Self-pay

## 2021-09-12 ENCOUNTER — Other Ambulatory Visit (HOSPITAL_COMMUNITY): Payer: Self-pay

## 2021-09-12 ENCOUNTER — Other Ambulatory Visit: Payer: Self-pay | Admitting: Medical

## 2021-09-12 MED ORDER — AMOXICILLIN 875 MG PO TABS
875.0000 mg | ORAL_TABLET | Freq: Two times a day (BID) | ORAL | 0 refills | Status: DC
  Filled 2021-09-12: qty 20, 10d supply, fill #0

## 2021-09-12 MED ORDER — PREDNISONE 10 MG (21) PO TBPK
ORAL_TABLET | ORAL | 0 refills | Status: DC
  Filled 2021-09-12: qty 21, 6d supply, fill #0

## 2021-09-12 NOTE — Telephone Encounter (Signed)
Pt left message said Alicia Alvarez said to call back if wasn't getting relief from pseudoephedrine, about getting steroid dose pack to [facility].  I called her back and she states she is no better, tried steams & pseudoephedrine, fullness in both ear, worse in right, headache, pain in forehead, no fever, ringing in ears and low tone, keeps her awake.  Would like steroid dose pack called in.

## 2021-09-14 NOTE — Telephone Encounter (Signed)
Called pt to follow up and she is doing better and said to tell Alicia Alvarez thank you .

## 2021-09-19 ENCOUNTER — Telehealth: Payer: Self-pay | Admitting: Family Medicine

## 2021-09-19 NOTE — Telephone Encounter (Signed)
Left message for patient to call back and schedule Medicare Annual Wellness Visit (AWV) either virtually or in office. I left my number for patient to call [phone].  awvi 10/28/17 per [facility] ; please schedule at anytime with health coach  This should be a 45 minute visit.

## 2021-09-20 DIAGNOSIS — F4323 Adjustment disorder with mixed anxiety and depressed mood: Secondary | ICD-10-CM | POA: Diagnosis not present

## 2021-09-25 ENCOUNTER — Other Ambulatory Visit (HOSPITAL_COMMUNITY): Payer: Self-pay

## 2021-09-25 ENCOUNTER — Other Ambulatory Visit: Payer: Self-pay

## 2021-09-25 ENCOUNTER — Ambulatory Visit (INDEPENDENT_AMBULATORY_CARE_PROVIDER_SITE_OTHER): Payer: PPO | Admitting: Medical

## 2021-09-25 VITALS — BP 110/72 | HR 76 | Temp 97.9°F | Wt 156.0 lb

## 2021-09-25 DIAGNOSIS — R42 Dizziness and giddiness: Secondary | ICD-10-CM | POA: Diagnosis not present

## 2021-09-25 DIAGNOSIS — H6983 Other specified disorders of Eustachian tube, bilateral: Secondary | ICD-10-CM | POA: Diagnosis not present

## 2021-09-25 DIAGNOSIS — H9313 Tinnitus, bilateral: Secondary | ICD-10-CM | POA: Diagnosis not present

## 2021-09-25 DIAGNOSIS — J3489 Other specified disorders of nose and nasal sinuses: Secondary | ICD-10-CM | POA: Diagnosis not present

## 2021-09-25 MED ORDER — CLARITHROMYCIN 500 MG PO TABS
500.0000 mg | ORAL_TABLET | Freq: Two times a day (BID) | ORAL | 0 refills | Status: DC
  Filled 2021-09-25: qty 14, 7d supply, fill #0

## 2021-09-25 NOTE — Progress Notes (Signed)
Subjective: Chief Complaint  Patient presents with   Tinnitus    Still going on with low tone  Dizzy after she finished the steroid and abx    Here for dizziness, ringing in the ears and pressure in the head.  I saw her back in early February for 2 months with the ringing in the ears.  At that time she did have a lot of sinus infection symptoms.  She did ultimately end up taking prednisone and antibiotic and had pretty much resolution of symptoms.  Finished antibiotics 10 days ago.  However in the last few days she has started back up with sinus symptoms including fullness behind the ears, pain on left side of the head, sinus drainage, sore throat, headache, pain in the forehead.  Feels she has developed a sinus infection possibly.  Feels pressure in ears.  No sick contacts.  No fever.  No ataxia.  No nausea or vomiting.  Not currently using any medications for this.  In the past Sudafed has helped  No other aggravating or relieving factors. No other complaint.  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 20 tablet 0   predniSONE (STERAPRED UNI-PAK 21 TAB) 10 MG (21) TBPK tablet Take 6 tablets all together day 1, 5 tablets day 2, 4 tablets day 3, 3 tablets day 4, 2 tablets day 5, 1 tablet day 6. 21 each 0   zolpidem (AMBIEN) 5 MG tablet Take 1 tablet (5 mg total) by mouth at bedtime as needed for sleep. (Patient not taking: Reported on 09/06/2021) 30 tablet 0   No current facility-administered medications on file prior to visit.    The following portions of the patient's history were reviewed and updated as appropriate: allergies, current medications,  past family history, past medical history, past social history, past surgical history and problem list.  ROS Otherwise as in subjective above    Objective: BP 110/72    Pulse 76    Temp 97.9 F (36.6 C)    Wt 156 lb (70.8 kg)    LMP 07/30/2002    SpO2 97%    BMI 24.80 kg/m   General appearance: alert, no distress, well developed, well nourished HEENT: normocephalic, sclerae anicteric, conjunctiva pink and moist, TMs flat with reduced light reflex, otherwise TMs and canals normal,  nares patent, no discharge or erythema, pharynx normal Oral cavity: MMM, no lesions Neck: supple, no lymphadenopathy, no thyromegaly, no masses    Assessment: Encounter Diagnoses  Name Primary?   Dysfunction of both eustachian tubes Yes   Sinus pressure    Dizziness    Tinnitus of both ears       Plan: We did a hearing test last visit which was normal.  COVID test negative today  Hydrate well, begin another round of Sudafed once or twice daily for the next 5 days.  Can add Flonase nasal spray in the morning.  We also discussed possible use of Afrin at bedtime for 3 days or less.  Begin Biaxin below since symptoms resolved completely with antibiotics on the last round.  This could  be recurrent sinus infection.  Ultimately if not improving will refer to ENT  Kasheena was seen today for tinnitus.  Diagnoses and all orders for this visit:  Dysfunction of both eustachian tubes  Sinus pressure  Dizziness  Tinnitus of both ears  Other orders -     clarithromycin (BIAXIN) 500 MG tablet; Take 1 tablet (500 mg total) by mouth 2 (two) times daily.    Follow up: prn

## 2021-10-10 ENCOUNTER — Telehealth: Payer: Self-pay | Admitting: Family Medicine

## 2021-10-10 NOTE — Telephone Encounter (Signed)
Left message for patient to call back and schedule Medicare Annual Wellness Visit (AWV) either virtually or in office. ?I left my number for patient to call [phone]. ? ? awvi 10/28/17 per [facility]  ? please schedule at anytime with health coach ? ?This should be a 45 minute visit.   ?

## 2021-11-01 ENCOUNTER — Other Ambulatory Visit (HOSPITAL_COMMUNITY): Payer: Self-pay

## 2021-11-01 ENCOUNTER — Other Ambulatory Visit (HOSPITAL_BASED_OUTPATIENT_CLINIC_OR_DEPARTMENT_OTHER): Payer: Self-pay

## 2021-11-01 DIAGNOSIS — G4700 Insomnia, unspecified: Secondary | ICD-10-CM

## 2021-11-01 MED ORDER — ZOLPIDEM TARTRATE 5 MG PO TABS
5.0000 mg | ORAL_TABLET | Freq: Every evening | ORAL | 0 refills | Status: DC | PRN
  Filled 2021-11-01: qty 30, 30d supply, fill #0

## 2021-11-13 ENCOUNTER — Telehealth: Payer: Self-pay | Admitting: Family Medicine

## 2021-11-13 NOTE — Telephone Encounter (Signed)
Left message for patient to call back and schedule Medicare Annual Wellness Visit (AWV) either virtually or in office. ?I left my number for patient to call [phone]. ? ?awvi 10/28/17 per [facility]  ?please schedule at anytime with health coach ? ?This should be a 45 minute visit.   ?

## 2021-11-22 ENCOUNTER — Other Ambulatory Visit (HOSPITAL_COMMUNITY): Payer: Self-pay

## 2021-11-22 DIAGNOSIS — L814 Other melanin hyperpigmentation: Secondary | ICD-10-CM | POA: Diagnosis not present

## 2021-11-22 DIAGNOSIS — L570 Actinic keratosis: Secondary | ICD-10-CM | POA: Diagnosis not present

## 2021-11-22 DIAGNOSIS — B078 Other viral warts: Secondary | ICD-10-CM | POA: Diagnosis not present

## 2021-11-22 DIAGNOSIS — Z85828 Personal history of other malignant neoplasm of skin: Secondary | ICD-10-CM | POA: Diagnosis not present

## 2021-11-22 DIAGNOSIS — L438 Other lichen planus: Secondary | ICD-10-CM | POA: Diagnosis not present

## 2021-11-22 DIAGNOSIS — L918 Other hypertrophic disorders of the skin: Secondary | ICD-10-CM | POA: Diagnosis not present

## 2021-11-22 DIAGNOSIS — L720 Epidermal cyst: Secondary | ICD-10-CM | POA: Diagnosis not present

## 2021-11-22 DIAGNOSIS — D2271 Melanocytic nevi of right lower limb, including hip: Secondary | ICD-10-CM | POA: Diagnosis not present

## 2021-11-22 DIAGNOSIS — L821 Other seborrheic keratosis: Secondary | ICD-10-CM | POA: Diagnosis not present

## 2021-11-22 DIAGNOSIS — D2262 Melanocytic nevi of left upper limb, including shoulder: Secondary | ICD-10-CM | POA: Diagnosis not present

## 2021-11-22 MED ORDER — TRETINOIN 0.1 % EX CREA: TOPICAL_CREAM | CUTANEOUS | 3 refills | Status: DC

## 2021-11-22 MED ORDER — TRETINOIN 0.05 % EX CREA
1.0000 "application " | TOPICAL_CREAM | Freq: Every evening | CUTANEOUS | 3 refills | Status: DC
  Filled 2021-11-22: qty 20, 15d supply, fill #0

## 2021-11-22 MED ORDER — TRETINOIN 0.05 % EX CREA: TOPICAL_CREAM | CUTANEOUS | 3 refills | Status: DC

## 2021-11-23 ENCOUNTER — Telehealth: Payer: Self-pay | Admitting: Family Medicine

## 2021-11-23 NOTE — Telephone Encounter (Signed)
Left message for patient to call back and schedule Medicare Annual Wellness Visit (AWV) in office. ? ? ? ?If unable to come into the office for AWV,  please offer to do virtually or by telephone. ? ? ? ?No hx of AWV eligible as of 10/28/17 per [facility] ? ? ? ?Please schedule at anytime with PFM-Nurse Health Advisor. ? ? ? ?30 minute appointment for Virtual or phone ?45 minute appointment for in office or Initial virtual/phone ? ? ? ?Any questions, please contact me at [phone]   ?

## 2021-11-28 DIAGNOSIS — I83893 Varicose veins of bilateral lower extremities with other complications: Secondary | ICD-10-CM | POA: Diagnosis not present

## 2021-11-29 ENCOUNTER — Telehealth: Payer: Self-pay

## 2021-11-29 NOTE — Telephone Encounter (Signed)
Pt called back and declined scheduling AWV. She stated that she has  it done at her GYN. ?

## 2021-12-01 ENCOUNTER — Ambulatory Visit (INDEPENDENT_AMBULATORY_CARE_PROVIDER_SITE_OTHER): Payer: PPO | Admitting: Physician Assistant

## 2021-12-01 ENCOUNTER — Encounter: Payer: Self-pay | Admitting: Physician Assistant

## 2021-12-01 VITALS — BP 130/80 | HR 75 | Ht 66.5 in

## 2021-12-01 DIAGNOSIS — R350 Frequency of micturition: Secondary | ICD-10-CM | POA: Diagnosis not present

## 2021-12-01 LAB — POCT URINALYSIS DIP (PROADVANTAGE DEVICE)
Bilirubin, UA: NEGATIVE
Blood, UA: NEGATIVE
Glucose, UA: NEGATIVE mg/dL
Ketones, POC UA: NEGATIVE mg/dL
Leukocytes, UA: NEGATIVE
Nitrite, UA: NEGATIVE
Protein Ur, POC: NEGATIVE mg/dL
Specific Gravity, Urine: 1.01
Urobilinogen, Ur: 0.2
pH, UA: 6 (ref 5.0–8.0)

## 2021-12-01 NOTE — Progress Notes (Signed)
? ?Acute Office Visit ? ?Subjective:  ? ? Patient ID: Alicia Alvarez, female    DOB: 08-16-1950, 71 y.o.   MRN: [id number] ? ?Chief Complaint  ?Patient presents with  ? Acute Visit  ?   Possible uti- Burning, frequency and urgency  ? ? ?HPI ?Patient is in today for urinary burning, frequency, and urgency for about 5 days; reports that she swims several miles a week; last UTI was about 10 years ago; usually drink 5 - 6 bottles of water a day; bathes in a tub once a week with Neutrogena and usually doesn't have issues; reports that she used to take Hormone Replacement Therapy, but isn't right now. ? ?Outpatient Medications Prior to Visit  ?Medication Sig Dispense Refill  ? zolpidem (AMBIEN) 5 MG tablet Take 1 tablet (5 mg total) by mouth at bedtime as needed for sleep. 30 tablet 0  ? amoxicillin (AMOXIL) 875 MG tablet Take 1 tablet (875 mg total) by mouth 2 (two) times daily. (Patient not taking: Reported on 12/01/2021) 20 tablet 0  ? clarithromycin (BIAXIN) 500 MG tablet Take 1 tablet (500 mg total) by mouth 2 (two) times daily. (Patient not taking: Reported on 12/01/2021) 14 tablet 0  ? predniSONE (STERAPRED UNI-PAK 21 TAB) 10 MG (21) TBPK tablet Take 6 tablets all together day 1, 5 tablets day 2, 4 tablets day 3, 3 tablets day 4, 2 tablets day 5, 1 tablet day 6. (Patient not taking: Reported on 12/01/2021) 21 each 0  ? tretinoin (RETIN-A) 0.05 % cream Apply 1 a small amount to affected area every night (Patient not taking: Reported on 12/01/2021) 20 g 3  ? tretinoin (RETIN-A) 0.05 % cream Apply a small amount to affected area  topically every night (Patient not taking: Reported on 12/01/2021) 20 g 3  ? tretinoin (RETIN-A) 0.1 % cream Apply 1 a small amount to affected area every night (Patient not taking: Reported on 12/01/2021) 20 g 3  ? ?No facility-administered medications prior to visit.  ? ? ?Allergies  ?Allergen Reactions  ? Macrodantin [Nitrofurantoin] Rash  ? Sulfa Antibiotics Hives and Rash  ? ? ?Review of Systems   ?Constitutional:  Negative for activity change and chills.  ?HENT:  Negative for congestion and voice change.   ?Eyes:  Negative for pain and redness.  ?Respiratory:  Negative for cough and wheezing.   ?Cardiovascular:  Negative for chest pain.  ?Gastrointestinal:  Negative for constipation, diarrhea, nausea and vomiting.  ?Endocrine: Negative for polyuria.  ?Genitourinary:  Positive for dysuria and frequency.  ?Skin:  Negative for color change and rash.  ?Allergic/Immunologic: Negative for immunocompromised state.  ?Neurological:  Negative for dizziness.  ?Psychiatric/Behavioral:  Negative for agitation.   ? ?   ?Objective:  ?  ?Physical Exam ?Vitals and nursing note reviewed.  ?Constitutional:   ?   General: She is not in acute distress. ?   Appearance: Normal appearance. She is not ill-appearing.  ?HENT:  ?   Head: Normocephalic and atraumatic.  ?   Right Ear: External ear normal.  ?   Left Ear: External ear normal.  ?   Nose: No congestion.  ?Eyes:  ?   Extraocular Movements: Extraocular movements intact.  ?   Conjunctiva/sclera: Conjunctivae normal.  ?   Pupils: Pupils are equal, round, and reactive to light.  ?Cardiovascular:  ?   Rate and Rhythm: Normal rate and regular rhythm.  ?   Pulses: Normal pulses.  ?   Heart sounds: Normal heart sounds.  ?  Pulmonary:  ?   Effort: Pulmonary effort is normal.  ?   Breath sounds: Normal breath sounds. No wheezing.  ?Abdominal:  ?   General: Bowel sounds are normal.  ?   Palpations: Abdomen is soft.  ?   Tenderness: There is no right CVA tenderness or left CVA tenderness.  ?Musculoskeletal:     ?   General: Normal range of motion.  ?   Cervical back: Normal range of motion and neck supple.  ?   Right lower leg: No edema.  ?   Left lower leg: No edema.  ?Skin: ?   General: Skin is warm and dry.  ?   Findings: No bruising.  ?Neurological:  ?   General: No focal deficit present.  ?   Mental Status: She is alert and oriented to person, place, and time.  ?Psychiatric:     ?    Mood and Affect: Mood normal.     ?   Behavior: Behavior normal.     ?   Thought Content: Thought content normal.  ? ? ?BP 130/80   Pulse 75   Ht 5' 6.5" (1.689 m)   LMP 07/30/2002   SpO2 98%   BMI 24.80 kg/m?  patient declined to be weighed ? ?Wt Readings from Last 3 Encounters:  ?09/25/21 156 lb (70.8 kg)  ?03/20/21 150 lb 9.6 oz (68.3 kg)  ?02/16/21 150 lb (68 kg)  ? ? ?Results for orders placed or performed in visit on 12/01/21  ?POCT Urinalysis DIP (Proadvantage Device)  ?Result Value Ref Range  ? Color, UA yellow yellow  ? Clarity, UA clear clear  ? Glucose, UA negative negative mg/dL  ? Bilirubin, UA negative negative  ? Ketones, POC UA negative negative mg/dL  ? Specific Gravity, Urine 1.010   ? Blood, UA negative negative  ? pH, UA 6.0 5.0 - 8.0  ? Protein Ur, POC negative negative mg/dL  ? Urobilinogen, Ur 0.2   ? Nitrite, UA Negative Negative  ? Leukocytes, UA Negative Negative  ? ? ?   ?Assessment & Plan:  ?1. Urinary frequency ?- POCT Urinalysis DIP (Proadvantage Device) - normal ?- discussed causes of urinary burning and frequency ?- if symptoms persist can refer to a Urogynecologist or she can follow up with her Gynecologist for evaluation of atrophic vaginitis or interstitial cystitis; if referral is needed can call to request ?- Drink about 64 ounces of water a day, avoid or limit caffeine, take OTC cranberry supplements which support bladder health, eat a low sugar diet, blueberries are low in sugar and also support bladder health, after urinating always wipe front to back, urinate after sex, limit sitting in a tub to take baths ? ? ? ? ?No orders of the defined types were placed in this encounter. ? ? ?Return for Call for a Follow Up Appointment. ? ?Offered for patient to make an annual exam appointment with Dr. Redmond School, her PCP, she declined and states that her Ob-Gyn takes care of all of her health maintenance. ? ? ?Irene Pap, PA-C ?

## 2021-12-06 ENCOUNTER — Other Ambulatory Visit (HOSPITAL_BASED_OUTPATIENT_CLINIC_OR_DEPARTMENT_OTHER): Payer: Self-pay | Admitting: Obstetrics & Gynecology

## 2021-12-06 MED ORDER — NONFORMULARY OR COMPOUNDED ITEM: 3 refills | Status: DC

## 2022-02-13 ENCOUNTER — Telehealth (HOSPITAL_BASED_OUTPATIENT_CLINIC_OR_DEPARTMENT_OTHER): Payer: Self-pay

## 2022-02-13 ENCOUNTER — Other Ambulatory Visit (HOSPITAL_COMMUNITY): Payer: Self-pay

## 2022-02-13 ENCOUNTER — Other Ambulatory Visit (HOSPITAL_BASED_OUTPATIENT_CLINIC_OR_DEPARTMENT_OTHER): Payer: Self-pay | Admitting: Obstetrics & Gynecology

## 2022-02-13 DIAGNOSIS — G4700 Insomnia, unspecified: Secondary | ICD-10-CM

## 2022-02-13 MED ORDER — ZOLPIDEM TARTRATE 5 MG PO TABS
5.0000 mg | ORAL_TABLET | Freq: Every evening | ORAL | 0 refills | Status: DC | PRN
  Filled 2022-02-13: qty 30, 30d supply, fill #0

## 2022-02-13 NOTE — Telephone Encounter (Signed)
Patient called today requesting a refill of zopidem '5mg'$ . Patient was last seen 03/20/2021. Please advise.

## 2022-02-26 DIAGNOSIS — H5213 Myopia, bilateral: Secondary | ICD-10-CM | POA: Diagnosis not present

## 2022-02-26 DIAGNOSIS — H2513 Age-related nuclear cataract, bilateral: Secondary | ICD-10-CM | POA: Diagnosis not present

## 2022-02-26 DIAGNOSIS — H1789 Other corneal scars and opacities: Secondary | ICD-10-CM | POA: Diagnosis not present

## 2022-03-16 ENCOUNTER — Ambulatory Visit (INDEPENDENT_AMBULATORY_CARE_PROVIDER_SITE_OTHER): Payer: PPO | Admitting: Obstetrics & Gynecology

## 2022-03-16 ENCOUNTER — Encounter (HOSPITAL_BASED_OUTPATIENT_CLINIC_OR_DEPARTMENT_OTHER): Payer: Self-pay | Admitting: Obstetrics & Gynecology

## 2022-03-16 ENCOUNTER — Other Ambulatory Visit (HOSPITAL_COMMUNITY)
Admission: RE | Admit: 2022-03-16 | Discharge: 2022-03-16 | Disposition: A | Discharge status: Home / Self Care | Payer: PPO | Source: Ambulatory Visit | Attending: Obstetrics & Gynecology | Admitting: Obstetrics & Gynecology

## 2022-03-16 VITALS — BP 126/65 | HR 67 | Ht 67.0 in | Wt 151.2 lb

## 2022-03-16 DIAGNOSIS — Z124 Encounter for screening for malignant neoplasm of cervix: Secondary | ICD-10-CM

## 2022-03-16 DIAGNOSIS — Z780 Asymptomatic menopausal state: Secondary | ICD-10-CM | POA: Diagnosis not present

## 2022-03-16 DIAGNOSIS — G4700 Insomnia, unspecified: Secondary | ICD-10-CM

## 2022-03-16 DIAGNOSIS — Z86718 Personal history of other venous thrombosis and embolism: Secondary | ICD-10-CM

## 2022-03-16 DIAGNOSIS — Z01419 Encounter for gynecological examination (general) (routine) without abnormal findings: Secondary | ICD-10-CM | POA: Diagnosis not present

## 2022-03-16 DIAGNOSIS — Z1151 Encounter for screening for human papillomavirus (HPV): Secondary | ICD-10-CM | POA: Insufficient documentation

## 2022-03-16 DIAGNOSIS — E785 Hyperlipidemia, unspecified: Secondary | ICD-10-CM | POA: Diagnosis not present

## 2022-03-16 DIAGNOSIS — Z23 Encounter for immunization: Secondary | ICD-10-CM

## 2022-03-16 NOTE — Progress Notes (Unsigned)
71 y.o. [id number] Divorced White or Caucasian female here for breast and pelvic exam.  I am also following her for insomnia.  Refill for medication just recently done.  Also has some PMP vaginal dryness.  Uses Vit E vaginal suppositories.  Does not need RF right now.  Denies vaginal bleeding.  Is having some intermittent RUQ discomfort.  Not exactly pain.  D/w pt possible additional evaluation.  She would not proceed with any surgery if has stones.  For now, declines.  She is advised to call if worsens or becomes more persistent as would proceed with imaging.    Pt requests lab work today.  Patient's last menstrual period was 07/30/2002.          Sexually active: No.  Not in the past year. H/O STD:  no    Health Maintenance: PCP:  Dr. Redmond School.   Vaccines are up to date:  Discussed.  Will update Tdap today. Colonoscopy:  declines colonoscopy.  Declined cologuard.   MMG:  09/22/2020 Negative BMD:  declines Last pap smear:  01/04/2020 Negative.   H/o abnormal pap smear:  no   reports that she has quit smoking. Her smoking use included cigarettes. She has a 1.50 pack-year smoking history. She has never used smokeless tobacco. She reports current alcohol use of about 2.0 - 3.0 standard drinks of alcohol per week. She reports that she does not use drugs.  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   Hemorrhoids    external    Past Surgical History:  Procedure Laterality Date   adnoidectomy     as child   [place]   x [place] N/A 10/10/2015   Procedure: DILATATION & CURETTAGE/HYSTEROSCOPY WITH MYOSURE;  Surgeon: Megan Salon, MD;  Location: [facility];  Service: Gynecology;  Laterality: N/A;   DILATION AND CURETTAGE OF UTERUS     MAB in 1993 or 1994   SKIN BIOPSY Left 09/30/2019   verruca vulgaris,  irritated   SKIN BIOPSY Right 02/26/2020   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  One ml pv two to three times weekly.  Disp: 3 month supply. 36 each 3   zolpidem (AMBIEN) 5 MG tablet Take 1 tablet (5 mg total) by mouth at bedtime as needed for sleep. 30 tablet 0   No current facility-administered medications for this visit.    Family History  Problem Relation Age of Onset   Prostate cancer Father     Review of Systems  All other systems reviewed and are negative.   Exam:   BP 126/65 (BP Location: Left Arm, Patient Position: Sitting, Cuff Size: Large)   Pulse 67   Ht '5\' 7"'$  (1.702 m) Comment: Reported  Wt 151 lb 3.2 oz (68.6 kg)   LMP 07/30/2002   BMI 23.68 kg/m   Height: '5\' 7"'$  (170.2 cm) (Reported)  General appearance: alert, cooperative and appears stated age Breasts: normal appearance, no masses or tenderness Abdomen: soft, non-tender; bowel sounds normal; no masses,  no organomegaly Lymph nodes: Cervical, supraclavicular, and axillary nodes normal.  No abnormal inguinal nodes palpated Neurologic: Grossly normal  Pelvic: External genitalia:  no lesions              Urethra:  normal appearing urethra with no masses, tenderness or lesions              Bartholins and Skenes: normal                 Vagina: normal appearing vagina with atrophic changes and no discharge, no lesions              Cervix: no lesions              Pap taken: Yes.   Bimanual Exam:  Uterus:  normal size, contour, position, consistency, mobility, non-tender              Adnexa: normal adnexa and no mass, fullness, tenderness               Rectovaginal: Confirms               Anus:  normal sphincter tone, no lesions  Chaperone, Octaviano Batty, CMA, was present for  exam.  Assessment/Plan: 1. Encntr for gyn exam (general) (routine) w/o abn findings - Pap smear and HR HPV obtained today - Mammogram 09/2020 - Colonoscopy has been declined - Bone mineral density declined - lab work done done with PCP - vaccines reviewed/updated.  Tdap updated today.  2. Cervical cancer screening - Cytology - PAP( [place]) - PR OBTAINING SCREEN PAP SMEAR  3. Elevated lipids - CBC - Comprehensive metabolic panel - Hemoglobin A1c - Lipid panel  4. Postmenopausal - off HRT  5. Insomnia, unspecified type - uses zopidem prn  6. History of DVT (deep vein thrombosis)

## 2022-03-17 LAB — COMPREHENSIVE METABOLIC PANEL
ALT: 16 IU/L (ref 0–32)
AST: 20 IU/L (ref 0–40)
Albumin/Globulin Ratio: 2 (ref 1.2–2.2)
Albumin: 4.6 g/dL (ref 3.9–4.9)
Alkaline Phosphatase: 81 IU/L (ref 44–121)
BUN/Creatinine Ratio: 14 (ref 12–28)
BUN: 11 mg/dL (ref 8–27)
Bilirubin Total: 0.3 mg/dL (ref 0.0–1.2)
CO2: 23 mmol/L (ref 20–29)
Calcium: 9.9 mg/dL (ref 8.7–10.3)
Chloride: 101 mmol/L (ref 96–106)
Creatinine, Ser: 0.81 mg/dL (ref 0.57–1.00)
Globulin, Total: 2.3 g/dL (ref 1.5–4.5)
Glucose: 89 mg/dL (ref 70–99)
Potassium: 4.8 mmol/L (ref 3.5–5.2)
Sodium: 137 mmol/L (ref 134–144)
Total Protein: 6.9 g/dL (ref 6.0–8.5)
eGFR: 78 mL/min/{1.73_m2} (ref >59)

## 2022-03-17 LAB — LIPID PANEL
Chol/HDL Ratio: 2 ratio (ref 0.0–4.4)
Cholesterol, Total: 226 mg/dL — ABNORMAL HIGH (ref 100–199)
HDL: 111 mg/dL (ref >39)
LDL Chol Calc (NIH): 106 mg/dL — ABNORMAL HIGH (ref 0–99)
Triglycerides: 49 mg/dL (ref 0–149)
VLDL Cholesterol Cal: 9 mg/dL (ref 5–40)

## 2022-03-17 LAB — CBC
Hematocrit: 39.1 % (ref 34.0–46.6)
Hemoglobin: 13.2 g/dL (ref 11.1–15.9)
MCH: 30.8 pg (ref 26.6–33.0)
MCHC: 33.8 g/dL (ref 31.5–35.7)
MCV: 91 fL (ref 79–97)
Platelets: 220 10*3/uL (ref 150–450)
RBC: 4.28 x10E6/uL (ref 3.77–5.28)
RDW: 12.3 % (ref 11.7–15.4)
WBC: 4.7 10*3/uL (ref 3.4–10.8)

## 2022-03-17 LAB — HEMOGLOBIN A1C
Est. average glucose Bld gHb Est-mCnc: 103 mg/dL
Hgb A1c MFr Bld: 5.2 % (ref 4.8–5.6)

## 2022-03-18 ENCOUNTER — Encounter (HOSPITAL_BASED_OUTPATIENT_CLINIC_OR_DEPARTMENT_OTHER): Payer: Self-pay | Admitting: Obstetrics & Gynecology

## 2022-03-21 LAB — CYTOLOGY - PAP
Comment: NEGATIVE
Diagnosis: NEGATIVE
High risk HPV: NEGATIVE

## 2022-04-04 ENCOUNTER — Encounter: Payer: Self-pay | Admitting: Internal Medicine

## 2022-05-08 ENCOUNTER — Encounter: Payer: Self-pay | Admitting: Internal Medicine

## 2022-05-21 ENCOUNTER — Encounter: Payer: Self-pay | Admitting: Internal Medicine

## 2022-06-13 ENCOUNTER — Other Ambulatory Visit (HOSPITAL_BASED_OUTPATIENT_CLINIC_OR_DEPARTMENT_OTHER): Payer: Self-pay

## 2022-06-13 MED ORDER — COMIRNATY 30 MCG/0.3ML IM SUSY
PREFILLED_SYRINGE | INTRAMUSCULAR | 0 refills | Status: DC
  Filled 2022-06-13: qty 0.3, 1d supply, fill #0

## 2022-07-31 ENCOUNTER — Other Ambulatory Visit (HOSPITAL_COMMUNITY): Payer: Self-pay

## 2022-07-31 MED ORDER — VALACYCLOVIR HCL 1 G PO TABS
1000.0000 mg | ORAL_TABLET | Freq: Two times a day (BID) | ORAL | 2 refills | Status: DC | PRN
  Filled 2022-07-31: qty 30, 15d supply, fill #0

## 2022-10-25 ENCOUNTER — Ambulatory Visit: Payer: PPO | Admitting: Family Medicine

## 2022-11-06 ENCOUNTER — Ambulatory Visit (INDEPENDENT_AMBULATORY_CARE_PROVIDER_SITE_OTHER): Payer: PPO | Admitting: Family Medicine

## 2022-11-06 ENCOUNTER — Encounter: Payer: Self-pay | Admitting: Family Medicine

## 2022-11-06 VITALS — BP 146/90 | HR 64 | Temp 97.8°F | Resp 14 | Wt 156.8 lb

## 2022-11-06 DIAGNOSIS — M545 Low back pain, unspecified: Secondary | ICD-10-CM | POA: Diagnosis not present

## 2022-11-06 NOTE — Progress Notes (Signed)
   Subjective:    Patient ID: Alicia Alvarez, female    DOB: Apr 25, 1951, 71 y.o.   MRN: [id number]  HPI She complains of a 31-month history of difficulty with right hip discomfort.  She has had no injury or overuse.  She points to the area just lateral to the SI joint.  She has had massage as well as yoga on that.  The pain is made worse with walking.  She does occasionally complain of radiation down to her ankle.   Review of Systems     Objective:   Physical Exam Alert and in no distress.  Exam of the back does show some questionable tenderness in the area of the gluteus medius.  There is no SI joint tenderness.  Normal hip motion.  DTRs are 3+.  Negative straight leg raising.  No tenderness over greater trochanter.       Assessment & Plan:  Acute right-sided low back pain without sciatica - Plan: Ambulatory referral to Sports Medicine I explained that I do not think this was neurologic in nature and more muscular especially with the area of most concern being lateral to the SI joint.  No real tenderness over the sciatic notch and the negative straight leg raising.  I will refer to Dr. Shon Baton for possible ultrasound evaluation of this area.

## 2022-11-08 ENCOUNTER — Ambulatory Visit: Payer: PPO | Admitting: Sports Medicine

## 2022-11-08 ENCOUNTER — Other Ambulatory Visit: Payer: Self-pay

## 2022-11-08 DIAGNOSIS — R2689 Other abnormalities of gait and mobility: Secondary | ICD-10-CM

## 2022-11-08 DIAGNOSIS — M79604 Pain in right leg: Secondary | ICD-10-CM | POA: Diagnosis not present

## 2022-11-08 DIAGNOSIS — G5701 Lesion of sciatic nerve, right lower limb: Secondary | ICD-10-CM

## 2022-11-08 NOTE — Progress Notes (Signed)
Alicia Alvarez - 72 y.o. female MRN [id number]  Date of birth: Sep 23, 1950  Office Visit Note: Visit Date: 11/08/2022 PCP: Alicia Nian, MD Referred by: Alicia Nian, MD  Subjective: Chief Complaint  Patient presents with   Lower Back - Pain   HPI: Alicia Alvarez is a pleasant 72 y.o. female who presents today for about 2-3 months of right posterior hip/buttock pain.  She has pain with walking and prolonged standing.  Intermittently she will have some radiation down the posterior leg.  Denies any give way of the leg.  She does note that she has been walking differently.  Treatment so far has been massage as well as yoga.  Pertinent ROS were reviewed with the patient and found to be negative unless otherwise specified above in HPI.   Assessment & Plan: Visit Diagnoses:  1. Piriformis syndrome of right side   2. Pain in right leg   3. Functional gait abnormality    Plan: Discussed with Alicia Alvarez today the likely etiology of her posterior buttock and hip pain.  This does seem most indicative of piriformis syndrome.  She also has some asymmetries of her lateral hip abductors on the right greater than left which I feel is contributing to her gait abnormality.  Through shared decision making, elected to proceed with ultrasound-guided piriformis injection, patient tolerated well.  She may use ice, heat, or over-the-counter anti-inflammatories for any postinjection pain.  Discussed formal therapy versus home rehab, she wished to proceed with home rehab.  We did print out a customized handout for hip abduction and strengthening exercises.  My athletic trainer, Alicia Alvarez, did review these in the room with her today.  She is to start these over the weekend and perform once daily.  I would like to see what sort of progress she is making in 54-month for follow-up. If her pain is resolved, she can cancel.  If she is not improved, neck step would likely be starting with some x-ray imaging of the hips as  well as back/SI.  Follow-up: Return in about 4 weeks (around 12/06/2022) for for right post glute.   Meds & Orders: No orders of the defined types were placed in this encounter.   Orders Placed This Encounter  Procedures   [place] Extrem Low Right Ltd     Procedures: US-guided piriformis injection, right: After discussion on risks/benefits/indications and informed verbal consent was obtained, a timeout was performed. The patient was lying in prone position on exam table. Using ultrasound guidance, the piriformis muscle belly and tendon was identified in a transverse, long-axis plane.  The area overlying was prepped with ChloraPrep and multiple alcohol swabs.  Utilizing ultrasound guidance via an in-plane approach, a 22-gauge 3.5 inch needle was inserted from a medial to lateral direction with visualization of the needle tip within the sheath of the piriformis muscle and subsequent injection of 2:2:1 lidocaine:bupivicaine:betamethasone.  Delivery of the injectate was visualized under ultrasound examination.  Patient tolerated procedure well without immediate complication.        Clinical History: No specialty comments available.  She reports that she has quit smoking. Her smoking use included cigarettes. She has a 1.50 pack-year smoking history. She has never used smokeless tobacco.  Recent Labs    03/16/22 0951  HGBA1C 5.2    Objective:   Vital Signs: LMP 07/30/2002   Physical Exam  Gen: Well-appearing, in no acute distress; non-toxic CV: Regular Rate. Well-perfused. Warm.  Resp: Breathing unlabored on room air; no  wheezing. Psych: Fluid speech in conversation; appropriate affect; normal thought process Neuro: Sensation intact throughout. No gross coordination deficits.   Ortho Exam - Right hip/buttock: Positive TTP within the mid belly of the piriformis muscle.  There is fluid internal and external rotation about the hip without pain.  There is no true TTP over the SI joint region.  No  overlying swelling or redness in this location.  Very mild greater trochanteric TTP bilaterally.  There is some mild weakness on the right greater than left with resisted hip abduction.  Imaging: [place] Extrem Low Right Ltd  Result Date: 11/08/2022 Limited musculoskeletal ultrasound of the right lower extremity, right buttock and posterior hip was performed today.  Evaluation of the right SI joint shows no cortical regularity nor hyperemia in this region.  Evaluation of the posterior buttock shows overlying gluteus maximus without abnormality.  The piriformis was evaluated in both static and dynamic imaging with internal/external rotation about the hip.  There is no high-grade tearing, but there is notable hyperemia within the mid belly of the piriformis with scattered hyperechoic changes, possibly suggestive of intratendinous ossification.  Sciatic nerve was evaluated just inferior and deep to the piriformis. *Technically successful US-guided piriformis injection   Past Medical/Family/Surgical/Social History: Medications & Allergies reviewed per EMR, new medications updated. 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   Hemorrhoids    external   Family History  Problem Relation Age of Onset   Prostate cancer Father    Past Surgical History:  Procedure Laterality Date   adnoidectomy     as child   CESAREAN SECTION  1989   x 1   DILATATION & CURETTAGE/HYSTEROSCOPY WITH MYOSURE N/A 10/10/2015   Procedure: DILATATION & CURETTAGE/HYSTEROSCOPY WITH MYOSURE;  Surgeon: Jerene Bears, MD;  Location: [facility];  Service: Gynecology;  Laterality: N/A;   DILATION AND CURETTAGE OF UTERUS      MAB in 1993 or 1994   SKIN BIOPSY Left 09/30/2019   verruca vulgaris, irritated   SKIN BIOPSY Right 02/26/2020   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

## 2022-11-09 DIAGNOSIS — Z1231 Encounter for screening mammogram for malignant neoplasm of breast: Secondary | ICD-10-CM | POA: Diagnosis not present

## 2022-11-15 DIAGNOSIS — R928 Other abnormal and inconclusive findings on diagnostic imaging of breast: Secondary | ICD-10-CM | POA: Diagnosis not present

## 2022-11-15 DIAGNOSIS — N6321 Unspecified lump in the left breast, upper outer quadrant: Secondary | ICD-10-CM | POA: Diagnosis not present

## 2022-11-15 DIAGNOSIS — R922 Inconclusive mammogram: Secondary | ICD-10-CM | POA: Diagnosis not present

## 2022-11-22 ENCOUNTER — Encounter (HOSPITAL_BASED_OUTPATIENT_CLINIC_OR_DEPARTMENT_OTHER): Payer: Self-pay | Admitting: *Deleted

## 2022-11-23 ENCOUNTER — Other Ambulatory Visit (HOSPITAL_BASED_OUTPATIENT_CLINIC_OR_DEPARTMENT_OTHER): Payer: Self-pay

## 2022-11-23 ENCOUNTER — Other Ambulatory Visit (HOSPITAL_COMMUNITY): Payer: Self-pay

## 2022-11-23 ENCOUNTER — Other Ambulatory Visit (HOSPITAL_BASED_OUTPATIENT_CLINIC_OR_DEPARTMENT_OTHER): Payer: Self-pay | Admitting: Obstetrics & Gynecology

## 2022-11-23 DIAGNOSIS — G4700 Insomnia, unspecified: Secondary | ICD-10-CM

## 2022-11-23 MED ORDER — ZOLPIDEM TARTRATE 5 MG PO TABS
5.0000 mg | ORAL_TABLET | Freq: Every evening | ORAL | 0 refills | Status: DC | PRN
  Filled 2022-11-23: qty 30, 30d supply, fill #0

## 2022-11-23 MED ORDER — ZOLPIDEM TARTRATE 5 MG PO TABS: 5.0000 mg | ORAL_TABLET | Freq: Every evening | ORAL | 0 refills | Status: DC | PRN

## 2022-11-23 NOTE — Telephone Encounter (Signed)
Entered in Error

## 2022-11-27 DIAGNOSIS — B078 Other viral warts: Secondary | ICD-10-CM | POA: Diagnosis not present

## 2022-11-27 DIAGNOSIS — L821 Other seborrheic keratosis: Secondary | ICD-10-CM | POA: Diagnosis not present

## 2022-11-27 DIAGNOSIS — Z85828 Personal history of other malignant neoplasm of skin: Secondary | ICD-10-CM | POA: Diagnosis not present

## 2022-11-27 DIAGNOSIS — L570 Actinic keratosis: Secondary | ICD-10-CM | POA: Diagnosis not present

## 2022-11-28 ENCOUNTER — Other Ambulatory Visit (INDEPENDENT_AMBULATORY_CARE_PROVIDER_SITE_OTHER): Payer: PPO

## 2022-11-28 ENCOUNTER — Other Ambulatory Visit: Payer: Self-pay | Admitting: Sports Medicine

## 2022-11-28 ENCOUNTER — Encounter: Payer: Self-pay | Admitting: Sports Medicine

## 2022-11-28 ENCOUNTER — Ambulatory Visit (INDEPENDENT_AMBULATORY_CARE_PROVIDER_SITE_OTHER): Payer: PPO | Admitting: Sports Medicine

## 2022-11-28 ENCOUNTER — Other Ambulatory Visit (HOSPITAL_COMMUNITY): Payer: Self-pay

## 2022-11-28 DIAGNOSIS — G8929 Other chronic pain: Secondary | ICD-10-CM

## 2022-11-28 DIAGNOSIS — G5701 Lesion of sciatic nerve, right lower limb: Secondary | ICD-10-CM

## 2022-11-28 DIAGNOSIS — M4316 Spondylolisthesis, lumbar region: Secondary | ICD-10-CM

## 2022-11-28 DIAGNOSIS — M5441 Lumbago with sciatica, right side: Secondary | ICD-10-CM

## 2022-11-28 DIAGNOSIS — M533 Sacrococcygeal disorders, not elsewhere classified: Secondary | ICD-10-CM | POA: Diagnosis not present

## 2022-11-28 DIAGNOSIS — M5136 Other intervertebral disc degeneration, lumbar region: Secondary | ICD-10-CM | POA: Diagnosis not present

## 2022-11-28 MED ORDER — DICLOFENAC SODIUM 75 MG PO TBEC
75.0000 mg | DELAYED_RELEASE_TABLET | Freq: Two times a day (BID) | ORAL | 0 refills | Status: AC
  Filled 2022-11-28: qty 60, 30d supply, fill #0

## 2022-11-28 NOTE — Progress Notes (Signed)
Alicia Alvarez - 72 y.o. female MRN [id number]  Date of birth: [birth year]  Office Visit Note: Visit Date: 11/28/2022 PCP: Ronnald Nian, MD Referred by: Ronnald Nian, MD  Subjective: Chief Complaint  Patient presents with   Lower Back - Pain   Right Hip - Pain   HPI: Alicia Alvarez is a pleasant 72 y.o. female who presents today for follow-up of piriformis syndrome as well as LBP.  Last seen on 11/08/2022.  We did perform ultrasound-guided piriformis injection on the right.  Arine states this significantly helped her buttock pain and she is no longer having as much radicular/sciatic leg pain.  We also gave her some home exercises for hip abduction, she has been adherent to these and noticing improvement in strength.  Having bilateral pain on the lower lumbar spine. Infrequent lower extremity numbness/tingling. Nothing consistent for pain, occasional OTC anti-inflammatories.  Pertinent ROS were reviewed with the patient and found to be negative unless otherwise specified above in HPI.   Assessment & Plan: Visit Diagnoses:  1. Chronic midline low back pain with right-sided sciatica   2. Piriformis syndrome of right side   3. DDD (degenerative disc disease), lumbar   4. Anterolisthesis of lumbar spine   5. SI (sacroiliac) joint dysfunction    Plan: Discussed with [name] the likely etiology of her low back pain.  She does have degenerative disc disease most notably at L4-L5 with an anterior listhesis.  Possible that this is also contributing to some radicular symptoms, although most of her sciatica had been improved/resolved with the piriformis injection.  She does have some SI joint dysfunction which is all contributing to her lower back pain.  We discussed all treatment options such as oral medication, home exercise therapy, physical therapy, injection therapy, advanced imaging.  We will proceed with oral diclofenac 75 mg to be taken twice daily for the next 2-3 weeks schedule,  then as needed from there.  We did review McKenzie extension lumbar home exercises as well as SI joint home therapy, my athletic trainer Lequita Halt did review these exercises with her in the room.  She is to perform these once daily.  She is possibly moving to a different state, however I would like to see how she does with these modalities over the next 6 weeks and she can follow-up at her convenience.  Follow-up: Return if symptoms worsen or fail to improve.   Meds & Orders:  Meds ordered this encounter  Medications   diclofenac (VOLTAREN) 75 MG EC tablet    Sig: Take 1 tablet (75 mg total) by mouth 2 (two) times daily.    Dispense:  60 tablet    Refill:  0    Orders Placed This Encounter  Procedures   XR Lumbar Spine Complete   XR HIP UNILAT W OR W/O PELVIS 2-3 VIEWS RIGHT     Procedures: No procedures performed      Clinical History: No specialty comments available.  She reports that she has quit smoking. Her smoking use included cigarettes. She has a 1.50 pack-year smoking history. She has never used smokeless tobacco.  Recent Labs    03/16/22 0951  HGBA1C 5.2    Objective:   Vital Signs: LMP 07/30/2002   Physical Exam  Gen: Well-appearing, in no acute distress; non-toxic CV: Regular Rate. Well-perfused. Warm.  Resp: Breathing unlabored on room air; no wheezing. Psych: Fluid speech in conversation; appropriate affect; normal thought process Neuro: Sensation intact throughout. No gross coordination  deficits.   Ortho Exam - Lumbar spine: Patient has full range of motion with flexion and extension although flexion certainly worsens her pain.  Negative straight leg raise, equivocal modified slump test on the right.  5/5 strength of bilateral lower extremities.  There is some TTP noted bilaterally around the posterior SI joint and the L5 region without spinous process TTP.  - Hips: No greater trochanteric TTP.  There is fluid internal and external rotation about the hip.   Negative FADIR.  There is very mild weakness with right hip resisted abduction compared to the left hip, although improved since last exam.  Imaging: XR Lumbar Spine Complete  Result Date: 11/28/2022 2 view of the lumbar spine including AP and lateral film was ordered and reviewed by myself.  X-rays demonstrate lumbar DDD with at least moderate intervertebral disc space narrowing between L4 and L5.  There is an L5 anterior listhesis.  Facet arthropathy most notably at L5.  Mild SI joint sclerosis bilaterally, left greater than right.  No acute fracture noted.  XR HIP UNILAT W OR W/O PELVIS 2-3 VIEWS RIGHT  Result Date: 11/28/2022 2 views of the right hip including bilateral AP and lateral film were ordered and reviewed by myself.  X-rays demonstrate femoral head well-seated within the acetabulum.  There is minimal joint space narrowing.  No acute bony abnormality noted.   Past Medical/Family/Surgical/Social History: Medications & Allergies reviewed per EMR, new medications updated. 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   Hemorrhoids    external   Family History  Problem Relation Age of Onset   Prostate cancer Father    Past Surgical History:  Procedure Laterality Date   adnoidectomy     as child   CESAREAN SECTION  1989   x 1   DILATATION & CURETTAGE/HYSTEROSCOPY WITH MYOSURE N/A 10/10/2015   Procedure: DILATATION & CURETTAGE/HYSTEROSCOPY WITH MYOSURE;  Surgeon: Jerene Bears, MD;  Location: [facility];  Service: Gynecology;  Laterality: N/A;   DILATION AND CURETTAGE OF UTERUS     MAB in 1993 or 1994   SKIN BIOPSY Left 09/30/2019   verruca vulgaris,  irritated   SKIN BIOPSY Right 02/26/2020   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

## 2022-11-28 NOTE — Progress Notes (Signed)
Midline low back pain; chronic  Still having discomfort in right hip/piriformis  Patient was instructed in 10 minutes of therapeutic exercises for right hip/low back to improve strength, ROM and function according to my instructions and plan of care by a Certified Athletic Trainer during the office visit. A customized handout was provided and demonstration of proper technique shown and discussed. Patient did perform exercises and demonstrate understanding through teachback.  All questions discussed and answered.

## 2022-12-03 ENCOUNTER — Encounter: Payer: Self-pay | Admitting: Sports Medicine

## 2022-12-07 DIAGNOSIS — H1789 Other corneal scars and opacities: Secondary | ICD-10-CM | POA: Diagnosis not present

## 2022-12-07 DIAGNOSIS — H5213 Myopia, bilateral: Secondary | ICD-10-CM | POA: Diagnosis not present

## 2022-12-07 DIAGNOSIS — H2513 Age-related nuclear cataract, bilateral: Secondary | ICD-10-CM | POA: Diagnosis not present

## 2023-08-02 ENCOUNTER — Other Ambulatory Visit (HOSPITAL_COMMUNITY): Payer: Self-pay

## 2023-09-24 ENCOUNTER — Encounter: Payer: Self-pay | Admitting: Internal Medicine

## 2023-12-02 ENCOUNTER — Other Ambulatory Visit (HOSPITAL_COMMUNITY): Payer: Self-pay

## 2023-12-02 ENCOUNTER — Ambulatory Visit (INDEPENDENT_AMBULATORY_CARE_PROVIDER_SITE_OTHER): Admitting: Sports Medicine

## 2023-12-02 DIAGNOSIS — M5136 Other intervertebral disc degeneration, lumbar region with discogenic back pain only: Secondary | ICD-10-CM | POA: Diagnosis not present

## 2023-12-02 DIAGNOSIS — M7062 Trochanteric bursitis, left hip: Secondary | ICD-10-CM | POA: Diagnosis not present

## 2023-12-02 DIAGNOSIS — G5701 Lesion of sciatic nerve, right lower limb: Secondary | ICD-10-CM | POA: Diagnosis not present

## 2023-12-02 DIAGNOSIS — M545 Low back pain, unspecified: Secondary | ICD-10-CM

## 2023-12-02 MED ORDER — DICLOFENAC SODIUM 50 MG PO TBEC
50.0000 mg | DELAYED_RELEASE_TABLET | Freq: Every day | ORAL | 1 refills | Status: DC | PRN
  Filled 2023-12-02: qty 30, 30d supply, fill #0

## 2023-12-02 NOTE — Progress Notes (Unsigned)
 Alicia Alvarez - 73 y.o. female MRN [id number]  Date of birth: September 29, 1950  Office Visit Note: Visit Date: 12/02/2023 PCP: Watson Hacking, MD Referred by: Watson Hacking, MD  Subjective: No chief complaint on file.  HPI: Alicia Alvarez is a pleasant 73 y.o. female who presents today for ***  L-knee  L-hip  Pertinent ROS were reviewed with the patient and found to be negative unless otherwise specified above in HPI.   Assessment & Plan: Visit Diagnoses: No diagnosis found.  Plan: ***  Follow-up: No follow-ups on file.   Meds & Orders: No orders of the defined types were placed in this encounter.  No orders of the defined types were placed in this encounter.    Procedures: Large Joint Inj: L greater trochanter on 12/02/2023 9:25 AM Indications: pain Details: 25 G 1.5 in needle, lateral approach Medications: 2 mL lidocaine  1 %; 2 mL bupivacaine  0.25 %; 6 mg betamethasone acetate-betamethasone sodium phosphate  6 (3-3) MG/ML Outcome: tolerated well, no immediate complications Procedure, treatment alternatives, risks and benefits explained, specific risks discussed. Consent was given by the patient. Immediately prior to procedure a time out was called to verify the correct patient, procedure, equipment, support staff and site/side marked as required. Patient was prepped and draped in the usual sterile fashion.         Clinical History: No specialty comments available.  She reports that she has quit smoking. Her smoking use included cigarettes. She has a 1.5 pack-year smoking history. She has never used smokeless tobacco. No results for input(s): "HGBA1C", "LABURIC" in the last 8760 hours.  Objective:   Vital Signs: LMP 07/30/2002   Physical Exam  Gen: Well-appearing, in no acute distress; non-toxic CV: Well-perfused. Warm.  Resp: Breathing unlabored on room air; no wheezing. Psych: Fluid speech in conversation; appropriate affect; normal thought process  Ortho  Exam - Left knee:  - Left hip:  - Lumbar: ***  Imaging: No results found.  Past Medical/Family/Surgical/Social History: Medications & Allergies reviewed per EMR, new medications updated. Patient Active Problem List   Diagnosis Date Noted  . Insomnia 03/23/2021  . Postmenopausal 03/23/2021  . History of DVT (deep vein thrombosis) 01/17/2021  . Intramural leiomyoma of uterus 09/25/2015   Past Medical History:  Diagnosis Date  . Ankle fracture    right - no surgery required  . DVT (deep venous thrombosis) (HCC)   . 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 Hemorrhoids    external   Family History  Problem Relation Age of Onset  . Prostate cancer Father    Past Surgical History:  Procedure Laterality Date  . adnoidectomy     as child  . CESAREAN SECTION  1989   x 1  . DILATATION & CURETTAGE/HYSTEROSCOPY WITH MYOSURE N/A 10/10/2015   Procedure: DILATATION & CURETTAGE/HYSTEROSCOPY WITH MYOSURE;  Surgeon: Lillian Rein, MD;  Location: [facility];  Service: Gynecology;  Laterality: N/A;  . DILATION AND CURETTAGE OF UTERUS     MAB in 1993 or 1994  . SKIN BIOPSY Left 09/30/2019   verruca vulgaris, irritated  . 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 SKIN BIOPSY Right 02/26/2020   Superficial basal cell carcinoma   Social History   Occupational History  . Not on file  Tobacco Use  . Smoking status: Former    Current packs/day: 0.25    Average packs/day: 0.3 packs/day for 6.0 years (  1.5 ttl pk-yrs)    Types: Cigarettes  . Smokeless tobacco: Never  Substance and Sexual Activity  . Alcohol use: Yes    Alcohol/week: 2.0 - 3.0 standard drinks of alcohol    Types: 2 - 3 Standard drinks or equivalent per week    Comment: moderate use  . Drug use: No  . Sexual activity: Not Currently    Partners: Male    Birth control/protection: Post-menopausal

## 2023-12-02 NOTE — Progress Notes (Unsigned)
 Patient says that she was evaluated last year for hip and low back pain. She says that in the time since then she has been traveling; she was out of the country for about 6 months. She says that about 2 months ago she hurt her left knee while swimming - it seems to have healed but she would like to have it looked at today. She is still having left hip and low back pain. She is unable to take the medication that she was prescribed as she started taking new blood pressure medication soon after her visit last year. She takes Ibuprofen  as needed which gives her relief. She does do her back/hip home exercises a couple of times a week. Patient says that the pain is in the side of her left hip and does seem to radiate down the side and past the knee; she denies any numbness, tingling, or burning in the leg. Patient denies any pain or symptoms on the right side. She would prefer not to have any new x-rays today.

## 2023-12-03 ENCOUNTER — Other Ambulatory Visit (HOSPITAL_COMMUNITY): Payer: Self-pay

## 2023-12-03 MED ORDER — BETAMETHASONE SOD PHOS & ACET 6 (3-3) MG/ML IJ SUSP
6.0000 mg | INTRAMUSCULAR | Status: AC | PRN
Start: 2023-12-02 — End: 2023-12-02
  Administered 2023-12-02: 6 mg via INTRA_ARTICULAR

## 2023-12-03 MED ORDER — BUPIVACAINE HCL 0.25 % IJ SOLN
2.0000 mL | INTRAMUSCULAR | Status: AC | PRN
Start: 2023-12-02 — End: 2023-12-02
  Administered 2023-12-02: 2 mL via INTRA_ARTICULAR

## 2023-12-03 MED ORDER — LIDOCAINE HCL 1 % IJ SOLN
2.0000 mL | INTRAMUSCULAR | Status: AC | PRN
Start: 2023-12-02 — End: 2023-12-02
  Administered 2023-12-02: 2 mL

## 2024-01-16 ENCOUNTER — Other Ambulatory Visit (HOSPITAL_COMMUNITY): Payer: Self-pay

## 2024-02-13 ENCOUNTER — Encounter: Payer: Self-pay | Admitting: Sports Medicine

## 2024-02-13 ENCOUNTER — Other Ambulatory Visit (HOSPITAL_COMMUNITY): Payer: Self-pay

## 2024-02-13 ENCOUNTER — Ambulatory Visit (INDEPENDENT_AMBULATORY_CARE_PROVIDER_SITE_OTHER): Admitting: Sports Medicine

## 2024-02-13 DIAGNOSIS — M25552 Pain in left hip: Secondary | ICD-10-CM

## 2024-02-13 DIAGNOSIS — G8929 Other chronic pain: Secondary | ICD-10-CM | POA: Diagnosis not present

## 2024-02-13 DIAGNOSIS — M5136 Other intervertebral disc degeneration, lumbar region with discogenic back pain only: Secondary | ICD-10-CM | POA: Diagnosis not present

## 2024-02-13 MED ORDER — PREDNISONE 20 MG PO TABS
20.0000 mg | ORAL_TABLET | Freq: Every day | ORAL | 0 refills | Status: AC
  Filled 2024-02-13: qty 7, 7d supply, fill #0

## 2024-02-13 NOTE — Progress Notes (Signed)
 Alicia Alvarez - 73 y.o. female MRN [id number]  Date of birth: 09-12-1950  Office Visit Note: Visit Date: 02/13/2024 PCP: Alicia Norleen BROCKS, MD Referred by: Alicia Norleen BROCKS, MD  Subjective: Chief Complaint  Patient presents with   Left Hip - Follow-up   Lower Back - Follow-up   HPI: Alicia Alvarez is a pleasant 73 y.o. female who presents today for follow-up of left buttock/hip pain, also having some low back pain.  Back on 12/02/2023 we did perform left greater trochanteric injection, she received rather great relief about 90+ percent improvement for the first 3-4 weeks, and then following the month she started noticing her pain was returning.  Her pain is localized to more so the lateral aspect of the hip and will radiate down the lateral leg pointing to the IT band.  She has no numbness or tingling or radicular symptoms.  No pain that goes down past the knees.  She does have some left-sided low back pain but this is not as bad as her hip.  She initially was taking oral diclofenac  50 mg daily which in the past was helpful, although this seemed to make her sleepy so she discontinued.  Using ibuprofen  only as needed.  Recently her pain has been worse and laying on that side will wake her up at night.  Since our last visit, her medial knee pain has improved and resolved.  Pertinent ROS were reviewed with the patient and found to be negative unless otherwise specified above in HPI.   Assessment & Plan: Visit Diagnoses:  1. Greater trochanteric pain syndrome of left lower extremity   2. Chronic left hip pain   3. Degeneration of intervertebral disc of lumbar region with discogenic back pain   4. Chronic left-sided low back pain without sciatica    Plan: Impression is acute on chronic left sided lateral hip pain with evidence of GTPS.  She does have pain with activation of her gluteal tendons and resisted hip abduction, although she does not have any significant weakness.  She received  nearly complete relief of her symptoms after greater trochanteric injection but this only lasted about 1 month.  She has performed multiple sessions of formalized physical therapy in the past without significant relief.  Given this, I would like to obtain an MRI of the left hip to evaluate the greater trochanteric bursa and the gluteus medius and minimus to visualize any abnormal pathology or tendon tearing/significant tendinopathy that is causing the reoccurrence of her lateral hip pain.  She does have fairly advanced degenerative disc disease specifically at the L4-L5 level of the low back which does bother her at times, although she has no provocative radicular symptoms coming from the back so I do not think MRI is warranted for the lumbar spine at this time.  Discussed waiting for further injection therapy until after MRI, for her short-term pain, I will put her on prednisone  20 mg for 1 week only.  Following that she may use ibuprofen  or over-the-counter anti-inflammatories as needed.  We will await MRI to visualize the tendon and bursal quality.  We discussed other options such as extracorporeal shockwave therapy, NTG patch protocol, or PRP injection therapy.  Additional treatment considerations may include Norvel Piedra specialized DN and soft tissue treatment  Follow-up: Return for f/u 1-week after MRI L-hip to review and discuss next steps .   Meds & Orders:  Meds ordered this encounter  Medications   predniSONE  (DELTASONE ) 20 MG tablet  Sig: Take 1 tablet (20 mg total) by mouth daily with breakfast for 7 days.    Dispense:  7 tablet    Refill:  0   No orders of the defined types were placed in this encounter.    Procedures: No procedures performed      Clinical History: No specialty comments available.  She reports that she has quit smoking. Her smoking use included cigarettes. She has a 1.5 pack-year smoking history. She has never used smokeless tobacco. No results for  input(s): HGBA1C, LABURIC in the last 8760 hours.  Objective:   Vital Signs: LMP 07/30/2002   Physical Exam  Gen: Well-appearing, in no acute distress; non-toxic CV: Well-perfused. Warm.  Resp: Breathing unlabored on room air; no wheezing. Psych: Fluid speech in conversation; appropriate affect; normal thought process  Ortho Exam - Lumbar: There is good range of motion with flexion and extension.  Negative straight leg raise and negative modified slump test.  - Left hip: There is TTP over the left greater trochanteric region and just off the posterior facet near the gluteal insertion.  No tenderness on the contralateral right hip.  The left hip moves fluidly with internal and external logroll without restriction.  Negative FADIR test, negative Stinchfield test.  Mild pain with FABER testing.  Imaging:  *Did review both her lumbar spine and hip x-rays during today's visit today.  11/28/22: XR Lumbar Spine Complete 2 view of the lumbar spine including AP and lateral film was ordered and  reviewed by myself.  X-rays demonstrate lumbar DDD with at least moderate  intervertebral disc space narrowing between L4 and L5.  There is an L5  anterior listhesis.  Facet arthropathy most notably at L5.  Mild SI joint  sclerosis bilaterally, left greater than right.  No acute fracture noted. XR HIP UNILAT W OR W/O PELVIS 2-3 VIEWS RIGHT 2 views of the right hip including bilateral AP and lateral film were  ordered and reviewed by myself.  X-rays demonstrate femoral head  well-seated within the acetabulum.  There is minimal joint space  narrowing.  No acute bony abnormality noted.    Past Medical/Family/Surgical/Social History: Medications & Allergies reviewed per EMR, new medications updated. 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   Hemorrhoids    external   Family History  Problem Relation Age of Onset   Prostate cancer Father    Past Surgical History:  Procedure Laterality Date   adnoidectomy     as child   CESAREAN SECTION  1989   x 1   DILATATION & CURETTAGE/HYSTEROSCOPY WITH MYOSURE N/A 10/10/2015   Procedure: DILATATION & CURETTAGE/HYSTEROSCOPY WITH MYOSURE;  Surgeon: Ronal GORMAN Pinal, MD;  Location: [facility];  Service: Gynecology;  Laterality: N/A;   DILATION AND CURETTAGE OF UTERUS     MAB in 1993 or 1994   SKIN BIOPSY Left 09/30/2019   verruca vulgaris, irritated   SKIN BIOPSY Right 02/26/2020   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

## 2024-02-13 NOTE — Progress Notes (Signed)
 Patient says that she got about 3-4 weeks of good relief from the greater trochanter injection. She says that the pain in the side of the hip will wake her up at night. It does seem to radiate down the side of the leg to the knee, but does not go past the knee. She does mention that her medial knee pain has improved since her last visit and is no longer causing discomfort. Patient also mentions left-sided low back pain, which is not as bad as the hip pain. She has not done physical therapy as she has done it in the past with little relief and is unsure if it would be helpful for her now. She tried the Diclofenac  that was prescribed at the last visit and says that a full dose made her sleepy, but a half dose did not give her relief. She has discontinued the Diclofenac  and takes Ibuprofen  only as needed.

## 2024-02-18 ENCOUNTER — Other Ambulatory Visit (HOSPITAL_COMMUNITY): Payer: Self-pay

## 2024-02-18 MED ORDER — IMIQUIMOD 5 % EX CREA
1.0000 | TOPICAL_CREAM | CUTANEOUS | 0 refills | Status: AC
  Filled 2024-02-18: qty 24, 30d supply, fill #0

## 2024-02-21 ENCOUNTER — Other Ambulatory Visit (HOSPITAL_COMMUNITY): Payer: Self-pay

## 2024-02-26 ENCOUNTER — Telehealth: Payer: Self-pay

## 2024-02-26 NOTE — Telephone Encounter (Signed)
 Patient called her insurance about getting a MRI. Stated that Dr. Burnetta was going to order one for her, so she needs to know where she will be going for it.  She wants a return call at [phone] she has other questions also.

## 2024-02-28 ENCOUNTER — Other Ambulatory Visit: Payer: Self-pay | Admitting: Sports Medicine

## 2024-02-28 DIAGNOSIS — M25552 Pain in left hip: Secondary | ICD-10-CM

## 2024-02-28 DIAGNOSIS — G8929 Other chronic pain: Secondary | ICD-10-CM

## 2024-02-28 NOTE — Telephone Encounter (Signed)
 Called patient back and confirmed that [facility] is okay for MRI order. She says that she will confirm with them, also, when they call to schedule. MRI w/o contrast ordered for left hip for  Chronic left sided lateral hip pain with evidence of GTPS. Evaluate greater trochanteric bursa and gluteus medius and minimus for tendon tearing/tendinopathy.

## 2024-03-10 ENCOUNTER — Ambulatory Visit: Admitting: Family Medicine

## 2024-03-10 DIAGNOSIS — Z86718 Personal history of other venous thrombosis and embolism: Secondary | ICD-10-CM

## 2024-03-10 DIAGNOSIS — Z780 Asymptomatic menopausal state: Secondary | ICD-10-CM

## 2024-03-10 DIAGNOSIS — D251 Intramural leiomyoma of uterus: Secondary | ICD-10-CM

## 2024-03-12 ENCOUNTER — Encounter: Payer: Self-pay | Admitting: Sports Medicine

## 2024-03-12 ENCOUNTER — Other Ambulatory Visit: Payer: Self-pay | Admitting: Sports Medicine

## 2024-03-12 ENCOUNTER — Other Ambulatory Visit (HOSPITAL_COMMUNITY): Payer: Self-pay

## 2024-03-12 MED ORDER — DIAZEPAM 5 MG PO TABS
5.0000 mg | ORAL_TABLET | Freq: Once | ORAL | 0 refills | Status: AC
  Filled 2024-03-12: qty 1, 1d supply, fill #0

## 2024-03-13 ENCOUNTER — Other Ambulatory Visit (HOSPITAL_COMMUNITY): Payer: Self-pay

## 2024-03-16 ENCOUNTER — Ambulatory Visit
Admission: RE | Admit: 2024-03-16 | Discharge: 2024-03-16 | Disposition: A | Discharge status: Home / Self Care | Source: Ambulatory Visit | Attending: Sports Medicine

## 2024-03-16 DIAGNOSIS — M25552 Pain in left hip: Secondary | ICD-10-CM

## 2024-03-16 DIAGNOSIS — G8929 Other chronic pain: Secondary | ICD-10-CM

## 2024-03-19 ENCOUNTER — Other Ambulatory Visit (HOSPITAL_COMMUNITY): Payer: Self-pay

## 2024-03-19 ENCOUNTER — Encounter: Payer: Self-pay | Admitting: Family Medicine

## 2024-03-19 ENCOUNTER — Ambulatory Visit: Admitting: Family Medicine

## 2024-03-19 VITALS — BP 136/76 | HR 71 | Ht 67.0 in | Wt 150.0 lb

## 2024-03-19 DIAGNOSIS — D251 Intramural leiomyoma of uterus: Secondary | ICD-10-CM

## 2024-03-19 DIAGNOSIS — I10 Essential (primary) hypertension: Secondary | ICD-10-CM | POA: Diagnosis not present

## 2024-03-19 DIAGNOSIS — Z780 Asymptomatic menopausal state: Secondary | ICD-10-CM

## 2024-03-19 DIAGNOSIS — Z13220 Encounter for screening for lipoid disorders: Secondary | ICD-10-CM

## 2024-03-19 DIAGNOSIS — F5102 Adjustment insomnia: Secondary | ICD-10-CM

## 2024-03-19 DIAGNOSIS — R058 Other specified cough: Secondary | ICD-10-CM

## 2024-03-19 DIAGNOSIS — T464X5A Adverse effect of angiotensin-converting-enzyme inhibitors, initial encounter: Secondary | ICD-10-CM

## 2024-03-19 DIAGNOSIS — Z86718 Personal history of other venous thrombosis and embolism: Secondary | ICD-10-CM

## 2024-03-19 DIAGNOSIS — Z6379 Other stressful life events affecting family and household: Secondary | ICD-10-CM

## 2024-03-19 LAB — CBC WITH DIFFERENTIAL/PLATELET
Basophils Absolute: 0.1 x10E3/uL (ref 0.0–0.2)
Basos: 1 %
EOS (ABSOLUTE): 0.1 x10E3/uL (ref 0.0–0.4)
Eos: 3 %
Hematocrit: 40 % (ref 34.0–46.6)
Hemoglobin: 12.9 g/dL (ref 11.1–15.9)
Immature Grans (Abs): 0 x10E3/uL (ref 0.0–0.1)
Immature Granulocytes: 0 %
Lymphocytes Absolute: 1.7 x10E3/uL (ref 0.7–3.1)
Lymphs: 44 %
MCH: 30.5 pg (ref 26.6–33.0)
MCHC: 32.3 g/dL (ref 31.5–35.7)
MCV: 95 fL (ref 79–97)
Monocytes Absolute: 0.5 x10E3/uL (ref 0.1–0.9)
Monocytes: 12 %
Neutrophils Absolute: 1.6 x10E3/uL (ref 1.4–7.0)
Neutrophils: 40 %
Platelets: 194 x10E3/uL (ref 150–450)
RBC: 4.23 x10E6/uL (ref 3.77–5.28)
RDW: 12.5 % (ref 11.7–15.4)
WBC: 3.9 x10E3/uL (ref 3.4–10.8)

## 2024-03-19 LAB — LIPID PANEL
Chol/HDL Ratio: 2.3 ratio (ref 0.0–4.4)
Cholesterol, Total: 217 mg/dL — ABNORMAL HIGH (ref 100–199)
HDL: 95 mg/dL (ref >39)
LDL Chol Calc (NIH): 110 mg/dL — ABNORMAL HIGH (ref 0–99)
Triglycerides: 68 mg/dL (ref 0–149)
VLDL Cholesterol Cal: 12 mg/dL (ref 5–40)

## 2024-03-19 LAB — COMPREHENSIVE METABOLIC PANEL WITH GFR
ALT: 23 IU/L (ref 0–32)
AST: 22 IU/L (ref 0–40)
Albumin: 4.3 g/dL (ref 3.8–4.8)
Alkaline Phosphatase: 79 IU/L (ref 44–121)
BUN/Creatinine Ratio: 15 (ref 12–28)
BUN: 12 mg/dL (ref 8–27)
Bilirubin Total: 0.5 mg/dL (ref 0.0–1.2)
CO2: 20 mmol/L (ref 20–29)
Calcium: 10 mg/dL (ref 8.7–10.3)
Chloride: 102 mmol/L (ref 96–106)
Creatinine, Ser: 0.79 mg/dL (ref 0.57–1.00)
Globulin, Total: 2.4 g/dL (ref 1.5–4.5)
Glucose: 84 mg/dL (ref 70–99)
Potassium: 4.6 mmol/L (ref 3.5–5.2)
Sodium: 137 mmol/L (ref 134–144)
Total Protein: 6.7 g/dL (ref 6.0–8.5)
eGFR: 79 mL/min/1.73 (ref >59)

## 2024-03-19 MED ORDER — ZOLPIDEM TARTRATE 5 MG PO TABS
5.0000 mg | ORAL_TABLET | Freq: Every evening | ORAL | 0 refills | Status: DC | PRN
  Filled 2024-03-19: qty 30, 30d supply, fill #0

## 2024-03-19 MED ORDER — LOSARTAN POTASSIUM 25 MG PO TABS
25.0000 mg | ORAL_TABLET | Freq: Every day | ORAL | 3 refills | Status: AC
  Filled 2024-03-19: qty 90, 90d supply, fill #0

## 2024-03-19 NOTE — Progress Notes (Signed)
   Subjective:    Patient ID: Alicia Alvarez, female    DOB: 10-02-1950, 73 y.o.   MRN: [id number]  Discussed the use of AI scribe software for clinical note transcription with the patient, who gave verbal consent to proceed.  History of Present Illness   Alicia Alvarez is a 73 year old female with hypertension who presents for blood work and blood pressure check.  Her blood pressure began to rise over a year ago during a stressful period while living with her sister in [place], with readings occasionally reaching 160/100 mmHg. She was prescribed lisinopril 10 mg and has been taking it since May of last year. She experiences a persistent cough as a side effect but no other adverse effects. Her current blood pressure reading is 134/80 mmHg, and she reports it is usually around 120/80 mmHg.  She wants to reduce her medication dosage, citing a healthier lifestyle with less stress. She gained 10 pounds over the past year, attributing it to her previous lifestyle. She does not own a blood pressure cuff but is willing to obtain one for home monitoring.  She is experiencing significant stress due to her daughter's difficult pregnancy and plans to move to [place]  to be closer to her daughter. She has reduced alcohol consumption and exercises regularly, swimming three miles a week.  She occasionally uses 25 mg of Benadryl for sleep but has not taken any regular medication for insomnia. She previously used Ambien  years ago and is considering using it again for occasional sleepless nights.  She is interested in having blood work done, including cholesterol and triglycerides, due to a history of high cholesterol.          Review of Systems     Objective:    Physical Exam Alert and in no distress. Tympanic membranes and canals are normal. Pharyngeal area is normal. Neck is supple without adenopathy or thyromegaly. Cardiac exam shows a regular sinus rhythm without murmurs or gallops. Lungs are  clear to auscultation.              Assessment & Plan:  Assessment and Plan    Hypertension with ACE inhibitor-induced cough Hypertension managed with lisinopril, but persistent cough identified as ACE inhibitor-induced. Blood pressure close to target. Losartan  recommended due to absence of cough. - Discontinue lisinopril. - Prescribe losartan  as alternative antihypertensive. - Advise obtaining home blood pressure cuff and checking blood pressure weekly. - Schedule follow-up in one month to review blood pressure readings and assess medication efficacy. - Advise bringing blood pressure cuff to follow-up to ensure accuracy.  Suspected hyperlipidemia Proceeding with lipid testing is reasonable despite non-fasting state. - Order lipid panel to assess cholesterol and triglyceride levels.  Insomnia Occasional insomnia with previous use of Ambien  and Benadryl. Ambien  acceptable for as-needed use. - Prescribe Ambien  5 mg for as-needed use for insomnia.

## 2024-03-20 ENCOUNTER — Ambulatory Visit: Payer: Self-pay | Admitting: Family Medicine

## 2024-03-23 ENCOUNTER — Encounter: Payer: Self-pay | Admitting: Sports Medicine

## 2024-03-23 ENCOUNTER — Ambulatory Visit (INDEPENDENT_AMBULATORY_CARE_PROVIDER_SITE_OTHER): Admitting: Sports Medicine

## 2024-03-23 ENCOUNTER — Other Ambulatory Visit: Payer: Self-pay

## 2024-03-23 ENCOUNTER — Other Ambulatory Visit (HOSPITAL_COMMUNITY): Payer: Self-pay

## 2024-03-23 DIAGNOSIS — M7062 Trochanteric bursitis, left hip: Secondary | ICD-10-CM

## 2024-03-23 DIAGNOSIS — M25552 Pain in left hip: Secondary | ICD-10-CM

## 2024-03-23 DIAGNOSIS — S76012A Strain of muscle, fascia and tendon of left hip, initial encounter: Secondary | ICD-10-CM | POA: Diagnosis not present

## 2024-03-23 DIAGNOSIS — G8929 Other chronic pain: Secondary | ICD-10-CM | POA: Diagnosis not present

## 2024-03-23 DIAGNOSIS — M7072 Other bursitis of hip, left hip: Secondary | ICD-10-CM | POA: Diagnosis not present

## 2024-03-23 MED ORDER — DICLOFENAC SODIUM 50 MG PO TBEC
50.0000 mg | DELAYED_RELEASE_TABLET | Freq: Every day | ORAL | 1 refills | Status: AC | PRN
  Filled 2024-03-23: qty 30, 30d supply, fill #0

## 2024-03-23 MED ORDER — NITROGLYCERIN 0.2 MG/HR TD PT24
MEDICATED_PATCH | TRANSDERMAL | 0 refills | Status: AC
  Filled 2024-03-23: qty 30, 90d supply, fill #0

## 2024-03-23 NOTE — Progress Notes (Signed)
 Patient says that her hip has been feeling a bit worse lately. She says that after she laid still for her MRI, she began feeling a twinge or spasm in the left hip. She says that it is very quick and sharp. She is here today for MRI review for her left hip.  Patient was instructed in 10 minutes of therapeutic exercises for left hip to improve strength, ROM and function according to my instructions and plan of care by a Certified Athletic Trainer during the office visit. A customized handout was provided and demonstration of proper technique shown and discussed. Patient did perform exercises and demonstrate understanding through teachback.  All questions discussed and answered.

## 2024-03-23 NOTE — Patient Instructions (Addendum)

## 2024-03-23 NOTE — Progress Notes (Signed)
 Alicia Alvarez - 73 y.o. female MRN [id number]  Date of birth: 07-23-1951  Office Visit Note: Visit Date: 03/23/2024 PCP: Alicia Norleen BROCKS, MD Referred by: Alicia Norleen BROCKS, MD  Subjective: Chief Complaint  Patient presents with   Left Hip - Follow-up   HPI: Alicia Alvarez is a pleasant 73 y.o. female who presents today for follow-up of chronic left hip pain, here for MRI review as well.  [name] does feel the hip is getting slightly worse.  Continues to be localized right over the lateral aspect of the hip.  As a reminder, back on 12/02/2023 we did perform ultrasound-guided greater trochanteric injection which gave her greater than 90% relief for about the next month, and then her pain slowly returned.  She is using oral diclofenac  50 mg daily as needed which does help to an extent.  She did have an MRI since and is here for further review of this.  Pertinent ROS were reviewed with the patient and found to be negative unless otherwise specified above in HPI.   Assessment & Plan: Visit Diagnoses:  1. Chronic left hip pain   2. Tear of left gluteus minimus tendon, initial encounter   3. Trochanteric bursitis, left hip   4. Iliopsoas bursitis of left hip    Plan: Impression is acute on chronic left lateral hip pain which does have evidence of insertional gluteus minimus and gluteus medius tendinopathy with interstitial tearing of the left gluteus minimus.  There is no full-thickness tearing nor acute intervention for surgical indication.  There is a small reactive trochanteric bursitis and iliopsoas bursitis on the anterior side which I believe is more reciprocal in nature.  She does have weakness with hip abduction however given her above pathology.  Given the above, we discussed treatment options including extracorporeal shockwave therapy, nitroglycerin  patch protocol, PRP injection therapy.  She would like to hold from further injections at this time.  We did repeat a treatment of  extracorporeal shockwave therapy today, patient tolerated well.  I would like to proceed with 2 additional treatments and see what sort of Camitta benefit she has going forward.  We will pare this with nitroglycerin  0.2 mg/h patch which she will change every 24 hours to augment blood flow and tendon healing.  Discussed the importance of performing therapy based exercises to help with her healing and recovering during the pharmacological and shockwave treatment, we did print out a customized handout and did review hip abduction and stabilization exercises with her today.  She will begin these once daily.  Okay for oral diclofenac  as needed.  She will follow-up in the next 1-2 weeks for repeat evaluation and further shockwave treatment.  Follow-up: Return for make 2 appts 1-week apart (L-lat hip).   Meds & Orders:  Meds ordered this encounter  Medications   diclofenac  (VOLTAREN ) 50 MG EC tablet    Sig: Take 1 tablet (50 mg total) by mouth daily as needed.    Dispense:  30 tablet    Refill:  1   nitroGLYCERIN  (NITRODUR - DOSED IN MG/24 HR) 0.2 mg/hr patch    Sig: Cut patch into fourths. Apply 1/4 patch over affected area of lateral hip. Change new patch every 24 hours.    Dispense:  22 patch    Refill:  0   No orders of the defined types were placed in this encounter.    Procedures: Procedure: ECSWT Indications:  GTPS, Gluteal tendinopathy/tendinitis   Procedure Details Consent: Risks of procedure  as well as the alternatives and risks of each were explained to the patient.  Verbal consent for procedure obtained. Time Out: Verified patient identification, verified procedure, site was marked, verified correct patient position. The area was cleaned with alcohol swab.     The left greater trochanteric region and gluteal tendons was targeted for Extracorporeal shockwave therapy.    Preset: Greater trochanteric pain syndrome Power Level: 100-110 mJ Frequency: 11-12 Hz Impulse/cycles: 2200 Head  size: Regular   Patient tolerated procedure well without immediate complications.         Clinical History: No specialty comments available.  She reports that she has quit smoking. Her smoking use included cigarettes. She has a 1.5 pack-year smoking history. She has never used smokeless tobacco. No results for input(s): HGBA1C, LABURIC in the last 8760 hours.  Objective:   Vital Signs: LMP 07/30/2002   Physical Exam  Gen: Well-appearing, in no acute distress; non-toxic CV: Well-perfused. Warm.  Resp: Breathing unlabored on room air; no wheezing. Psych: Fluid speech in conversation; appropriate affect; normal thought process  Ortho Exam - Left hip: + TTP over the greater trochanteric region as well as just posterior near the insertion of the gluteal tendons.  She does have some myofascial restriction over the TFL region as well.  I can reproduce pain with deep palpation over the anterior iliopsoas although good activation of this.  There is weakness noted with resisted hip abduction on the left greater than right hip.   Imaging:  *I did personally review the left hip MRI myself as well as with the patient in the room today.  MR Hip Left w/o contrast CLINICAL DATA:  Chronic left hip pain  EXAM: MR OF THE LEFT HIP WITHOUT CONTRAST  TECHNIQUE: Multiplanar, multisequence MR imaging was performed. No intravenous contrast was administered.  COMPARISON:  Radiographs 11/28/2022  FINDINGS: Bones: Spondylosis and degenerative disc disease at L4-5 with type 1 degenerative endplate findings along the inferior endplate of L4 as well as Schmorl's nodes along the inferior endplate of L4.  Articular cartilage and labrum  Articular cartilage: Mild to moderate axial articular cartilage thinning.  Labrum:  Grossly unremarkable  Joint or bursal effusion  Joint effusion:  Absent  Bursae: Trace left iliopsoas bursitis.  Muscles and tendons  Muscles and tendons: Left gluteus  minimus and to a lesser extent gluteus medius peritendinitis near the greater trochanter  Other findings  Miscellaneous:   Sigmoid colon diverticulosis.  IMPRESSION: 1. Left gluteus minimus and to a lesser extent gluteus medius peritendinitis near the greater trochanter. 2. Trace left iliopsoas bursitis. 3. Mild to moderate axial articular cartilage thinning in the left hip. 4. Spondylosis and degenerative disc disease at L4-5. 5. Sigmoid colon diverticulosis.  Electronically Signed   By: Ryan Salvage M.D.   On: 03/23/2024 08:39    Past Medical/Family/Surgical/Social History: Medications & Allergies reviewed per EMR, new medications updated. 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   Hemorrhoids    external   Family History  Problem Relation Age of Onset   Prostate cancer Father    Past Surgical History:  Procedure Laterality Date  adnoidectomy     as child   CESAREAN SECTION  1989   x 1   DILATATION & CURETTAGE/HYSTEROSCOPY WITH MYOSURE N/A 10/10/2015   Procedure: DILATATION & CURETTAGE/HYSTEROSCOPY WITH MYOSURE;  Surgeon: Ronal GORMAN Pinal, MD;  Location: [facility];  Service: Gynecology;  Laterality: N/A;   DILATION AND CURETTAGE OF UTERUS     MAB in 1993 or 1994   SKIN BIOPSY Left 09/30/2019   verruca vulgaris, irritated   SKIN BIOPSY Right 02/26/2020   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

## 2024-03-24 ENCOUNTER — Other Ambulatory Visit: Payer: Self-pay

## 2024-03-24 ENCOUNTER — Other Ambulatory Visit (HOSPITAL_COMMUNITY): Payer: Self-pay

## 2024-04-02 ENCOUNTER — Ambulatory Visit (INDEPENDENT_AMBULATORY_CARE_PROVIDER_SITE_OTHER): Admitting: Sports Medicine

## 2024-04-02 ENCOUNTER — Encounter: Payer: Self-pay | Admitting: Sports Medicine

## 2024-04-02 DIAGNOSIS — M7062 Trochanteric bursitis, left hip: Secondary | ICD-10-CM | POA: Diagnosis not present

## 2024-04-02 DIAGNOSIS — S76012D Strain of muscle, fascia and tendon of left hip, subsequent encounter: Secondary | ICD-10-CM

## 2024-04-02 DIAGNOSIS — G5701 Lesion of sciatic nerve, right lower limb: Secondary | ICD-10-CM

## 2024-04-02 DIAGNOSIS — M7918 Myalgia, other site: Secondary | ICD-10-CM | POA: Diagnosis not present

## 2024-04-02 NOTE — Progress Notes (Signed)
 Patient says that she feels she is moving in the right direction with her left hip pain. She says that she overall feels about 40% improved with the first shockwave therapy. She has been using the nitroglycerin  patches, although is having difficulty getting them to stay on her skin. She does mention her right-side piriformis pain. She says that it is currently very achy. She has been stretching and has been able to continue with yoga, walking, and swimming, although is inquiring about possible solutions for this pain. She has been doing her exercises on both the right and left side.

## 2024-04-02 NOTE — Progress Notes (Signed)
 Xu noted Alicia Alvarez she still is not she still less than that urinalysis to  Alicia Alvarez [name] Coup - 73 y.o. female MRN [id number]  Date of birth: April 26, 1951  Office Visit Note: Visit Date: 04/02/2024 PCP: Joyce Norleen BROCKS, MD Referred by: Joyce Norleen BROCKS, MD  Subjective: Chief Complaint  Patient presents with   Left Hip - Follow-up   HPI: Alicia Alvarez is a pleasant 73 y.o. female who presents today for follow-up of chronic left lateral hip pain.  Alicia Alvarez received a positive response to our first treatment of extracorporeal shockwave therapy. She does feel she is about 40% improved after this treatment.  She has been using the nitroglycerin  patch protocol as well, tolerating okay. She has been stretching and doing her yoga, walking and swimming.  She has been doing her home exercises as well on a consistent basis.  Uses diclofenac  50 mg once daily PRN for this and overall joint pain.  She also has been having an acute exacerbation of chronic right posterior buttock pain.  Has had this in the past and diagnosed with piriformis syndrome. Does have PT who works on this, appt upcoming.   Pertinent ROS were reviewed with the patient and found to be negative unless otherwise specified above in HPI.   Assessment & Plan: Visit Diagnoses:  1. Tear of left gluteus minimus tendon, subsequent encounter   2. Trochanteric bursitis, left hip   3. Piriformis syndrome of right side   4. Pain in right buttock    Plan: Impression is improving chronic left lateral hip pain with evidence of insertional gluteus minimus and medius tendinopathy with interstitial tearing of the left gluteus minimus.  MRI did confirm a small reactive bursitis of the greater trochanter.  She received good relief with a very positive response to her first treatment of extracorporeal shockwave therapy for the greater trochanteric region and gluteal tendons, we did repeat this again today.  She will continue her nitroglycerin  patch 0.2 mg/h  changing once daily.  She does have oral diclofenac  50 mg to use daily as needed.  Through shared decision making, we did proceed with piriformis sheath injection, advised on postinjection protocol.  May use heat and gentle stretching in the next few days.  Recommended she follow-up with her physical therapist for exercises and soft tissue treatment in the interim.  I will see her back next week to repeat shockwave treatment and reevaluate both of the above conditions.  Follow-up: Next week   Meds & Orders: No orders of the defined types were placed in this encounter.  No orders of the defined types were placed in this encounter.    Procedures: Procedure: ECSWT Indications:  GTPS, Gluteal tendinopathy/tendinitis   Procedure Details Consent: Risks of procedure as well as the alternatives and risks of each were explained to the patient.  Verbal consent for procedure obtained. Time Out: Verified patient identification, verified procedure, site was marked, verified correct patient position. The area was cleaned with alcohol swab.     The left greater trochanteric region and gluteal tendons was targeted for Extracorporeal shockwave therapy.    Preset: Greater trochanteric pain syndrome Power Level: 110 mJ Frequency: 12 Hz Impulse/cycles: 2400 Head size: Regular   Patient tolerated procedure well without immediate complications.      Procedure: piriformis injection, right After discussion on risks/benefits/indications and informed verbal consent was obtained, a timeout was performed. Patient was lying prone on exam table.The posterior hip and buttock was cleaned with Chloraprep and multiple alcohol swabs.  Then utilizing landmark and palpation guidance the patient's piriformis muscle and sheath was identified. Then, using a 25G, 1.5 needle the piriformis muscle sheath was injected with a combination of 1:1:1 lidocaine :bupivicaine:betamethasone . Patient tolerated well without immediate  complications.  Band-Aid was applied.   Clinical History: No specialty comments available.  She reports that she has quit smoking. Her smoking use included cigarettes. She has a 1.5 pack-year smoking history. She has never used smokeless tobacco. No results for input(s): HGBA1C, LABURIC in the last 8760 hours.  Objective:   Vital Signs: LMP 07/30/2002   Physical Exam  Gen: Well-appearing, in no acute distress; non-toxic CV: Well-perfused. Warm.  Resp: Breathing unlabored on room air; no wheezing. Psych: Fluid speech in conversation; appropriate affect; normal thought process  Ortho Exam - Left hip: + Mild TTP over the posterior aspect of the greater trochanter and gluteal insertion although certainly improved from previous visit.  No restriction with internal or external rotation.  - Right hip/buttock: + TTP with deep palpation within the piriformis musculature.  No redness or swelling.  There is mild TTP of the gluteal insertion on the greater trochanter but there is preserved hip abduction strength testing.  Imaging: No results found.  Past Medical/Family/Surgical/Social History: Medications & Allergies reviewed per EMR, new medications updated. 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   Hemorrhoids    external   Family History  Problem Relation Age of Onset   Prostate cancer Father    Past Surgical History:  Procedure Laterality Date   adnoidectomy     as child   CESAREAN SECTION  1989   x 1   DILATATION & CURETTAGE/HYSTEROSCOPY WITH MYOSURE N/A 10/10/2015   Procedure: DILATATION & CURETTAGE/HYSTEROSCOPY WITH MYOSURE;  Surgeon: Ronal GORMAN Pinal, MD;  Location: [facility];  Service: Gynecology;  Laterality: N/A;   DILATION AND CURETTAGE OF UTERUS     MAB in 1993 or 1994   SKIN BIOPSY Left 09/30/2019   verruca vulgaris, irritated   SKIN BIOPSY Right 02/26/2020   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

## 2024-04-09 ENCOUNTER — Ambulatory Visit: Payer: Self-pay

## 2024-04-09 ENCOUNTER — Encounter: Payer: Self-pay | Admitting: Sports Medicine

## 2024-04-09 ENCOUNTER — Ambulatory Visit: Admitting: Sports Medicine

## 2024-04-09 DIAGNOSIS — G8929 Other chronic pain: Secondary | ICD-10-CM

## 2024-04-09 DIAGNOSIS — M5136 Other intervertebral disc degeneration, lumbar region with discogenic back pain only: Secondary | ICD-10-CM

## 2024-04-09 DIAGNOSIS — M25552 Pain in left hip: Secondary | ICD-10-CM | POA: Diagnosis not present

## 2024-04-09 DIAGNOSIS — M533 Sacrococcygeal disorders, not elsewhere classified: Secondary | ICD-10-CM | POA: Diagnosis not present

## 2024-04-09 DIAGNOSIS — S76012D Strain of muscle, fascia and tendon of left hip, subsequent encounter: Secondary | ICD-10-CM

## 2024-04-09 NOTE — Progress Notes (Signed)
 Alicia Alvarez - 73 y.o. female MRN [id number]  Date of birth: August 01, 1950  Office Visit Note: Visit Date: 04/09/2024 PCP: Joyce Norleen BROCKS, MD Referred by: Joyce Norleen BROCKS, MD  Subjective: Chief Complaint  Patient presents with   Left Hip - Follow-up   HPI: Alicia Alvarez is a pleasant 73 y.o. female who presents today for follow-up of chronic left lateral hip pain and right low back/buttock pain.  Left hip -the left hip continues to improve, she received good relief from her second treatment of extracorporeal shockwave therapy.  Feels she is at least 60% improved overall.  She has been less consistent with her home exercises but is still doing these.  Has been able to take some time to take it easy and has been beneficial for her.  Still using nitroglycerin  patch.  Right buttock/LB -had definite reduction in her pain in the right buttock after piriformis injection at last visit.  She no longer has any radicular symptoms going down the leg.  She still continues with some right sided low back pain, pointing to the region of the SI joint.   Pertinent ROS were reviewed with the patient and found to be negative unless otherwise specified above in HPI.   Assessment & Plan: Visit Diagnoses:  1. Tear of left gluteus minimus tendon, subsequent encounter   2. Chronic left hip pain   3. Degeneration of intervertebral disc of lumbar region with discogenic back pain   4. Chronic right SI joint pain    Plan: Impression is improving chronic left lateral hip pain with MRI confirmed partial tear of the left gluteus minimus tendon.  She has responded well to extracorporeal shockwave therapy, we did repeat this treatment today, patient tolerated well.  She will continue her nitroglycerin  patch 0.2 mg/h changing every 24 hours to help augment tendon healing and blood flow augmentation.  She did have resolution of her piriformis pain status postinjection, she still is having pain more so over the right  low back/SI joint region.  She does have notable lumbar DDD at the L4-L5 level, but more of her symptoms seem to emanate from the right SI joint.  Did discuss soft tissue treatments for her to use at home, did provide her with my SI joint exercise handout which she will perform on her own for the next 2 weeks.  Could always consider ultrasound-guided SI joint injection if not settling down with above.  May use oral diclofenac  50 mg once daily as needed for this and joint pain.  *Additional treatment considerations: OMT  Follow-up: Return in about 11 days (around 04/20/2024) for f/u in 10-14 days for L-hip, R-SI joint (30-min for proc).   Meds & Orders: No orders of the defined types were placed in this encounter.  No orders of the defined types were placed in this encounter.    Procedures: Procedure: ECSWT Indications:  GTPS, Gluteal tendinopathy/tendinitis   Procedure Details Consent: Risks of procedure as well as the alternatives and risks of each were explained to the patient.  Verbal consent for procedure obtained. Time Out: Verified patient identification, verified procedure, site was marked, verified correct patient position. The area was cleaned with alcohol swab.     The left greater trochanteric region and gluteal tendons was targeted for Extracorporeal shockwave therapy.    Preset: Greater trochanteric pain syndrome Power Level: 110 mJ Frequency: 12 Hz Impulse/cycles: 2600 Head size: Regular   Patient tolerated procedure well without immediate complications.  Clinical History: No specialty comments available.  She reports that she has quit smoking. Her smoking use included cigarettes. She has a 1.5 pack-year smoking history. She has never used smokeless tobacco. No results for input(s): HGBA1C, LABURIC in the last 8760 hours.  Objective:   Vital Signs: LMP 07/30/2002   Physical Exam  Gen: Well-appearing, in no acute distress; non-toxic CV: Well-perfused. Warm.   Resp: Breathing unlabored on room air; no wheezing. Psych: Fluid speech in conversation; appropriate affect; normal thought process  Ortho Exam - Left hip: Mild TTP over the greater trochanteric region of the left hip, no surrounding bursitis or soft tissue swelling.  - Low back: + TTP over the right SI joint region just inferior and lateral to the PSIS.  Positive Fortin's point test.  Imaging:  2 view of the lumbar spine including AP and lateral film was ordered and  reviewed by myself.  X-rays demonstrate lumbar DDD with at least moderate  intervertebral disc space narrowing between L4 and L5.  There is an L5  anterior listhesis.  Facet arthropathy most notably at L5.  Mild SI joint  sclerosis bilaterally, left greater than right.  No acute fracture noted.   Past Medical/Family/Surgical/Social History: Medications & Allergies reviewed per EMR, new medications updated. 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   Hemorrhoids    external   Family History  Problem Relation Age of Onset   Prostate cancer Father    Past Surgical History:  Procedure Laterality Date   adnoidectomy     as child   CESAREAN SECTION  1989   x 1   DILATATION & CURETTAGE/HYSTEROSCOPY WITH MYOSURE N/A 10/10/2015   Procedure: DILATATION & CURETTAGE/HYSTEROSCOPY WITH MYOSURE;  Surgeon: Ronal GORMAN Pinal, MD;  Location: [facility];  Service: Gynecology;  Laterality: N/A;   DILATION AND CURETTAGE OF UTERUS     MAB in 1993 or 1994   SKIN BIOPSY Left 09/30/2019   verruca vulgaris, irritated   SKIN BIOPSY Right 02/26/2020   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

## 2024-04-09 NOTE — Progress Notes (Signed)
 Patient says that her right side is about 50% improved since the injection. She says that the radiating pain has gone away, and her overall pain is less consistent and less intense. She says that she does still have some pain in the right low back.  Patient has also had more improvement with the left side from the shockwave therapy. She says that she is about 50-60% improved overall. She has noticed improvements with point tenderness, as well as having less consistent and less intense pain. She does mention aching in the low back. She is here today for follow up and repeat shockwave therapy.

## 2024-04-09 NOTE — Telephone Encounter (Signed)
 FYI Only or Action Required?: Action required by provider: clinical question for provider.  Patient was last seen in primary care on 03/19/2024 by Joyce Norleen BROCKS, MD.  Called Nurse Triage reporting Advice Only.  Symptoms began ongoing since taking Losartan .  Interventions attempted: Nothing.  Symptoms are: gradually worsening.  Triage Disposition: Information or Advice Only Call  Patient/caregiver understands and will follow disposition?: Yes   Copied from CRM #[id number]. Topic: Clinical - Red Word Triage >> Apr 09, 2024  4:27 PM Fredrica W wrote: Red Word that prompted transfer to Nurse Triage: Losartan  side effects lightheaded all the time - low BP 92/64 Reason for Disposition  Health information question, no triage required and triager able to answer question    Routing to PCP for review.  Answer Assessment - Initial Assessment Questions 1. REASON FOR CALL: What is the main reason for your call? or How can I best help you?    In the past, pt stated she was having weight issues, stress, etc.  Her PCP started pt on Lisinopril - pt developed a cough - PCP discontinued this medication & pt does not want to restart.   Today pt called in to make PCP aware the Losartan  : is causing lightheadedness all the time, BP today 92/60.  Pt stated the lightheadedness makes her unsteady on feet. Pt would like to discontinue the Losartan  - but pt is wondering if d/c losartan  will she need some different or does she even need the medication anymore based on lab values, etc.  Please call pt to discuss plan of care.  Protocols used: Information Only Call - No Triage-A-AH

## 2024-04-10 ENCOUNTER — Telehealth: Payer: Self-pay

## 2024-04-10 ENCOUNTER — Encounter: Payer: Self-pay | Admitting: Family Medicine

## 2024-04-10 NOTE — Telephone Encounter (Signed)
 Copied from CRM #[id number]. Topic: Clinical - Medication Question >> Apr 09, 2024  4:24 PM Graeme ORN wrote: Reason for CRM: Patient called requesting Rx for [employer] vaccine [facility].   [facility] [address]-advised patient

## 2024-04-13 ENCOUNTER — Telehealth: Payer: Self-pay

## 2024-04-13 ENCOUNTER — Other Ambulatory Visit: Payer: Self-pay

## 2024-04-13 ENCOUNTER — Other Ambulatory Visit (HOSPITAL_COMMUNITY): Payer: Self-pay

## 2024-04-13 DIAGNOSIS — I10 Essential (primary) hypertension: Secondary | ICD-10-CM

## 2024-04-13 NOTE — Telephone Encounter (Signed)
 Copied from CRM #[id number]. Topic: Clinical - Order For Equipment >> Apr 13, 2024 10:44 AM Graeme ORN wrote: Reason for CRM: Patient called request Bp monitor Rx be sent to [place] - [facility] [address] [place] [address] Phone: [phone] Fax: [phone]. Thank You >> Apr 13, 2024  3:25 PM Fonda T wrote: Patient calling to check status of BP monitor requested earlier today.  Patient advised message was sent and informed of turn around time.  Patient requesting a return call to inform of status update, can be reached at [phone].    I called patient and let her know she doesn't need a prescription for a B/P monitor.- Angie [place]

## 2024-04-13 NOTE — Telephone Encounter (Unsigned)
 Copied from CRM #[id number]. Topic: Clinical - Order For Equipment >> Apr 13, 2024 10:44 AM Graeme ORN wrote: Reason for CRM: Patient called request Bp monitor Rx be sent to [place] - [facility] [address], [address] [place] [address] Phone: [phone] Fax: [phone]. Thank You

## 2024-04-13 NOTE — Telephone Encounter (Signed)
 Copied from CRM #[id number]. Topic: Clinical - Order For Equipment >> Apr 13, 2024 10:44 AM Graeme ORN wrote: Reason for CRM: Patient called request Bp monitor Rx be sent to [place] - [facility] [address], [address] [place] [address] Phone: [phone] Fax: [phone]. Thank You

## 2024-04-14 ENCOUNTER — Encounter: Payer: Self-pay | Admitting: Family Medicine

## 2024-04-14 ENCOUNTER — Other Ambulatory Visit (HOSPITAL_COMMUNITY): Payer: Self-pay

## 2024-04-16 ENCOUNTER — Other Ambulatory Visit (HOSPITAL_COMMUNITY): Payer: Self-pay

## 2024-04-16 MED ORDER — COMIRNATY 30 MCG/0.3ML IM SUSY
0.3000 mL | PREFILLED_SYRINGE | Freq: Once | INTRAMUSCULAR | 0 refills | Status: AC
  Filled 2024-04-16: qty 0.3, 1d supply, fill #0

## 2024-04-16 MED ORDER — OMRON 3 SERIES BP MONITOR DEVI
0 refills | Status: AC
  Filled 2024-04-16: qty 1, 30d supply, fill #0

## 2024-04-21 ENCOUNTER — Encounter: Payer: Self-pay | Admitting: Sports Medicine

## 2024-04-21 ENCOUNTER — Ambulatory Visit (INDEPENDENT_AMBULATORY_CARE_PROVIDER_SITE_OTHER): Admitting: Sports Medicine

## 2024-04-21 DIAGNOSIS — G5701 Lesion of sciatic nerve, right lower limb: Secondary | ICD-10-CM

## 2024-04-21 DIAGNOSIS — M7062 Trochanteric bursitis, left hip: Secondary | ICD-10-CM | POA: Diagnosis not present

## 2024-04-21 DIAGNOSIS — S76012D Strain of muscle, fascia and tendon of left hip, subsequent encounter: Secondary | ICD-10-CM

## 2024-04-21 DIAGNOSIS — G8929 Other chronic pain: Secondary | ICD-10-CM

## 2024-04-21 DIAGNOSIS — M533 Sacrococcygeal disorders, not elsewhere classified: Secondary | ICD-10-CM | POA: Diagnosis not present

## 2024-04-21 NOTE — Progress Notes (Signed)
 Patient says that the piriformis injection did really well for her, and she has not had any of the right-sided pain that she was having previously. She says that she has some tightness in the low back, but is working through that with exercises. She is still having soreness in the left hip, both anterior and lateral, as well as point tenderness over the lateral aspect of the left hip. She says that she had a COVID vaccine that did make her feel badly for a couple of days, and thinks that her full body aches and worsened hip pain was partly from that.

## 2024-04-21 NOTE — Progress Notes (Signed)
 Alicia Alvarez - 73 y.o. female MRN [id number]  Date of birth: Jul 25, 1951  Office Visit Note: Visit Date: 04/21/2024 PCP: Alicia Norleen BROCKS, MD Referred by: Alicia Norleen BROCKS, MD  Subjective: Chief Complaint  Patient presents with   Left Hip - Follow-up   Right Hip - Follow-up   HPI: Alicia Alvarez is a pleasant 73 y.o. female who presents today for follow-up of left hip GT and R-posterior hip.  Left lateral hip -this continues to do much better.  She is no longer having any pain with ADLs or pain with sleeping that is interfering.  She does think shockwave has been extremely helpful for this and she does continue on her nitroglycerin  patch protocol 0.2 mg/h. She has been more consistent with her home exercises as well as doing more yoga.  Right hip/SI -this side is doing much better as well.  Previous visits we did provide piriformis injection which really settle down her pain.  She still has some mild discomfort overlying the SI joint but does not think she needs an injection at this point given her pain relief.  Her exercises and increase yoga is making a difference as well.  Pertinent ROS were reviewed with the patient and found to be negative unless otherwise specified above in HPI.   Assessment & Plan: Visit Diagnoses:  1. Tear of left gluteus minimus tendon, subsequent encounter   2. Trochanteric bursitis, left hip   3. Piriformis syndrome of right side   4. Chronic right SI joint pain    Plan: Impression is improving chronic left lateral hip pain with partial tear of the left gluteus minimus tendon which has been making good strides with extracorporeal shockwave therapy, nitroglycerin  patch protocol as well as HEP/PT.  We did repeat ESWT today, patient tolerated well.  She will continue her nitroglycerin  patch 0.2 mg/h changing every 24 hours to help augment tendon healing and blood flow augmentation.  Her right posterior hip/SI joint pain has helped as well as she has been  doing SI joint exercises and benefiting from yoga and stretching/mobility, she will continue this.  She does use oral diclofenac  50 mg once daily as needed for her pains and overall joint pain, fine to continue.  We will see her back over the next few weeks to repeat shockwave and further evaluate both of her conditions as above.  When she feels she is significantly improved, we will take a 4 to 6 weeks holiday to see what cumulative benefit and long-lasting relief she receives from above treatment.  Follow-up: Return in about 13 days (around 05/04/2024) for make 2 appts about 1 week apart for f/u b/l hips .   Meds & Orders: No orders of the defined types were placed in this encounter.  No orders of the defined types were placed in this encounter.    Procedures: Procedure: ECSWT Indications:  GTPS, Gluteal tendinopathy/tendinitis   Procedure Details Consent: Risks of procedure as well as the alternatives and risks of each were explained to the patient.  Verbal consent for procedure obtained. Time Out: Verified patient identification, verified procedure, site was marked, verified correct patient position. The area was cleaned with alcohol swab.     The left greater trochanteric region and gluteal tendons was targeted for Extracorporeal shockwave therapy.    Preset: Greater trochanteric pain syndrome Power Level: 110 mJ Frequency: 12 Hz Impulse/cycles: 2800 Head size: Regular   Patient tolerated procedure well without immediate complications.  Clinical History: No specialty comments available.  She reports that she has quit smoking. Her smoking use included cigarettes. She has a 1.5 pack-year smoking history. She has never used smokeless tobacco. No results for input(s): HGBA1C, LABURIC in the last 8760 hours.  Objective:   Vital Signs: LMP 07/30/2002   Physical Exam  Gen: Well-appearing, in no acute distress; non-toxic CV: Well-perfused. Warm.  Resp: Breathing unlabored on  room air; no wheezing. Psych: Fluid speech in conversation; appropriate affect; normal thought process  Ortho Exam - Left hip: Minimal tenderness over the greater trochanteric region, no surrounding bursitis.  There is some mild pain over the proximal aspect of the IT band.  - Right hip/SI joint: No significant tenderness in the low back, mild SI joint compression test but certainly improved from previous visits.  No pain with FABER testing.  Imaging: No results found.  Past Medical/Family/Surgical/Social History: Medications & Allergies reviewed per EMR, new medications updated. 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   Hemorrhoids    external   Family History  Problem Relation Age of Onset   Prostate cancer Father    Past Surgical History:  Procedure Laterality Date   adnoidectomy     as child   CESAREAN SECTION  1989   x 1   DILATATION & CURETTAGE/HYSTEROSCOPY WITH MYOSURE N/A 10/10/2015   Procedure: DILATATION & CURETTAGE/HYSTEROSCOPY WITH MYOSURE;  Surgeon: Ronal GORMAN Pinal, MD;  Location: [facility];  Service: Gynecology;  Laterality: N/A;   DILATION AND CURETTAGE OF UTERUS     MAB in 1993 or 1994   SKIN BIOPSY Left 09/30/2019   verruca vulgaris, irritated   SKIN BIOPSY Right 02/26/2020   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

## 2024-05-07 ENCOUNTER — Ambulatory Visit (INDEPENDENT_AMBULATORY_CARE_PROVIDER_SITE_OTHER): Admitting: Sports Medicine

## 2024-05-07 ENCOUNTER — Encounter: Payer: Self-pay | Admitting: Sports Medicine

## 2024-05-07 DIAGNOSIS — S76012D Strain of muscle, fascia and tendon of left hip, subsequent encounter: Secondary | ICD-10-CM

## 2024-05-07 DIAGNOSIS — G8929 Other chronic pain: Secondary | ICD-10-CM

## 2024-05-07 DIAGNOSIS — M533 Sacrococcygeal disorders, not elsewhere classified: Secondary | ICD-10-CM | POA: Diagnosis not present

## 2024-05-07 DIAGNOSIS — M25552 Pain in left hip: Secondary | ICD-10-CM | POA: Diagnosis not present

## 2024-05-07 DIAGNOSIS — M1612 Unilateral primary osteoarthritis, left hip: Secondary | ICD-10-CM | POA: Diagnosis not present

## 2024-05-07 NOTE — Progress Notes (Signed)
 Alicia Alvarez - 73 y.o. female MRN [id number]  Date of birth: September 06, 1950  Office Visit Note: Visit Date: 05/07/2024 PCP: Joyce Norleen BROCKS, MD Referred by: Joyce Norleen BROCKS, MD  Subjective: Chief Complaint  Patient presents with   Left Hip - Follow-up   HPI: Alicia Alvarez is a pleasant 73 y.o. female who presents today for left lateral hip pain - lateral and anterior; R-SI joint.  In general, admits he continues to make progress.  Feels that he lateral hip has been much improved, although with weather changes are still more sore today.  She also gets some achiness over the front of the hip/thigh.  She has tolerated shockwave therapy well.  She continues being very active with physical activity, swimming, excetra.  She has not taken her oral diclofenac  recently as she has not felt she needs it.  She occasionally does have some dizziness while taking this.  Inquiring about this versus ibuprofen .  She is still doing her rehab exercises for her SI joint as well and this is quite stable.  Pertinent ROS were reviewed with the patient and found to be negative unless otherwise specified above in HPI.   Assessment & Plan: Visit Diagnoses:  1. Greater trochanteric pain syndrome of left lower extremity   2. Tear of left gluteus minimus tendon, subsequent encounter   3. Primary osteoarthritis of left hip   4. Chronic right SI joint pain    Plan: Impression is improving chronic left lateral hip pain with GTPS and MRI confirmed partial gluteus minimus tendon which has responded well to extracorporeal shockwave therapy, therapy/HEP as well as nitroglycerin  patch protocol.  We did repeat extracorporeal shockwave therapy today, patient tolerated well.  She will continue her nitroglycerin  patch 0.2 mg/h changing every 24 hours.  This was started at the end of August, will likely continue through the month of October and then discontinue.  She is having some pain and pain in the anterior hip, worse with  weather changes which is indicative of her cartilage loss with early arthritic changes.  Right SI joint is doing well, she will continue her SI joint home rehab exercises.  Given her improvement, we discussed discontinuing diclofenac  50 mg going forward.  I am okay for short burst of ibuprofen  600 mg as needed, but to lessen side effect profile we will discontinue diclofenac  at this time.  She will follow-up in 1 week for repeat evaluation and her last treatment of extracorporeal shockwave therapy for the lateral hip before then taking a holiday for at least 6 weeks and seeing cumulative benefit going forward.  Follow-up: Return in about 1 week (around 05/14/2024).   Meds & Orders: No orders of the defined types were placed in this encounter.  No orders of the defined types were placed in this encounter.    Procedures: Procedure: ECSWT Indications:  GTPS, Gluteal tendinopathy/tendinitis   Procedure Details Consent: Risks of procedure as well as the alternatives and risks of each were explained to the patient.  Verbal consent for procedure obtained. Time Out: Verified patient identification, verified procedure, site was marked, verified correct patient position. The area was cleaned with alcohol swab.     The left greater trochanteric region and gluteal tendons was targeted for Extracorporeal shockwave therapy.    Preset: Greater trochanteric pain syndrome Power Level: 110 mJ Frequency: 12 Hz Impulse/cycles: 2800 Head size: Regular   Patient tolerated procedure well without immediate complications.      Clinical History: No specialty comments  available.  She reports that she has quit smoking. Her smoking use included cigarettes. She has a 1.5 pack-year smoking history. She has never used smokeless tobacco. No results for input(s): HGBA1C, LABURIC in the last 8760 hours.  Objective:   Vital Signs: LMP 07/30/2002   Physical Exam  Gen: Well-appearing, in no acute distress;  non-toxic CV: Well-perfused. Warm.  Resp: Breathing unlabored on room air; no wheezing. Psych: Fluid speech in conversation; appropriate affect; normal thought process  Ortho Exam - Left hip: Mild TTP over the trochanteric bursa region with possible mild swelling although no hip joint effusion.  There is improved hip abduction strength testing but does have pain and fatigability after resisting for more than 3 seconds.  Mild tenderness down the IT band.  Full with logroll internally and externally, equivocal FADIR test.  Imaging:  *I did review left hip x-ray from 11/28/2022 as well as left hip MRI during the visit with patient today.   Narrative & Impression  CLINICAL DATA:  Chronic left hip pain   EXAM: MR OF THE LEFT HIP WITHOUT CONTRAST   TECHNIQUE: Multiplanar, multisequence MR imaging was performed. No intravenous contrast was administered.   COMPARISON:  Radiographs 11/28/2022   FINDINGS: Bones: Spondylosis and degenerative disc disease at L4-5 with type 1 degenerative endplate findings along the inferior endplate of L4 as well as Schmorl's nodes along the inferior endplate of L4.   Articular cartilage and labrum   Articular cartilage: Mild to moderate axial articular cartilage thinning.   Labrum:  Grossly unremarkable   Joint or bursal effusion   Joint effusion:  Absent   Bursae: Trace left iliopsoas bursitis.   Muscles and tendons   Muscles and tendons: Left gluteus minimus and to a lesser extent gluteus medius peritendinitis near the greater trochanter   Other findings   Miscellaneous:   Sigmoid colon diverticulosis.   IMPRESSION: 1. Left gluteus minimus and to a lesser extent gluteus medius peritendinitis near the greater trochanter. 2. Trace left iliopsoas bursitis. 3. Mild to moderate axial articular cartilage thinning in the left hip. 4. Spondylosis and degenerative disc disease at L4-5. 5. Sigmoid colon diverticulosis.     Electronically  Signed   By: Ryan Salvage M.D.   On: 03/23/2024 08:39    Past Medical/Family/Surgical/Social History: Medications & Allergies reviewed per EMR, new medications updated. 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   Hemorrhoids    external   Family History  Problem Relation Age of Onset   Prostate cancer Father    Past Surgical History:  Procedure Laterality Date   adnoidectomy     as child   CESAREAN SECTION  1989   x 1   DILATATION & CURETTAGE/HYSTEROSCOPY WITH MYOSURE N/A 10/10/2015   Procedure: DILATATION & CURETTAGE/HYSTEROSCOPY WITH MYOSURE;  Surgeon: Ronal GORMAN Pinal, MD;  Location: [facility];  Service: Gynecology;  Laterality: N/A;   DILATION AND CURETTAGE OF UTERUS     MAB in 1993 or 1994   SKIN BIOPSY Left 09/30/2019   verruca vulgaris, irritated   SKIN BIOPSY Right 02/26/2020   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

## 2024-05-07 NOTE — Progress Notes (Signed)
 Patient says that she feels she is continuing to improve. She says that she has less aching over the hip throughout the day, but still wakes up with pressure over the hip if she has been laying on her side. She is noticing more discomfort through the anterior hip, as well as some discomfort now in the left knee. She continues to do her home exercises, although not every day.

## 2024-05-14 ENCOUNTER — Ambulatory Visit: Admitting: Sports Medicine

## 2024-05-14 ENCOUNTER — Encounter: Payer: Self-pay | Admitting: Sports Medicine

## 2024-05-14 DIAGNOSIS — M25552 Pain in left hip: Secondary | ICD-10-CM | POA: Diagnosis not present

## 2024-05-14 DIAGNOSIS — S76012D Strain of muscle, fascia and tendon of left hip, subsequent encounter: Secondary | ICD-10-CM

## 2024-05-14 DIAGNOSIS — M533 Sacrococcygeal disorders, not elsewhere classified: Secondary | ICD-10-CM | POA: Diagnosis not present

## 2024-05-14 DIAGNOSIS — G8929 Other chronic pain: Secondary | ICD-10-CM

## 2024-05-14 DIAGNOSIS — M5136 Other intervertebral disc degeneration, lumbar region with discogenic back pain only: Secondary | ICD-10-CM

## 2024-05-14 NOTE — Progress Notes (Signed)
 Alicia Alvarez - 73 y.o. female MRN [id number]  Date of birth: [birth year]  Office Visit Note: Visit Date: 05/14/2024 PCP: Alicia Norleen BROCKS, MD Referred by: Alicia Norleen BROCKS, MD  Subjective: Chief Complaint  Patient presents with   Left Hip - Follow-up   HPI: Alicia Alvarez is a very pleasant 73 y.o. female who presents today for follow-up of right lateral hip pain with gluteal insufficiency as well as SI joint pain.  Alicia Alvarez is doing very well and has made significant progress for both the lateral hip as well as her back/SI joints.  She continues to be very active with yoga, swimming, as well as performing her rehab exercises for the SI joint and lateral hip.  She has responded very well to this as well as extracorporeal shockwave therapy.  She is still using her nitroglycerin  patch changing once daily.  She is asking about when she should stop this.  Pertinent ROS were reviewed with the patient and found to be negative unless otherwise specified above in HPI.   Assessment & Plan: Visit Diagnoses:  1. Greater trochanteric pain syndrome of left lower extremity   2. Tear of left gluteus minimus tendon, subsequent encounter   3. Chronic right SI joint pain   4. Degeneration of intervertebral disc of lumbar region with discogenic back pain    Plan: Impression is left GTPS with MRI confirmed partial gluteus minimus tendon tear which has responded excellently to extracorporeal shockwave therapy, as well as nitroglycerin  patch protocol.  We did repeat our final treatment of ESWT today, patient tolerated well.  In terms of her nitroglycerin  patch 0.2 mg/h, she did start this on 03/23/2024, I would like her to continue this through the month of October and then starting on 11/1 she may discontinue going forward.  The SI joint and her low back pain has been essentially diminished which is the first time in years. She will continue being proactive with her at home exercises, her yoga for both  stability and range of motion.  At this point given her improvement, I will see her back on an as-needed basis.  Follow-up: Return if symptoms worsen or fail to improve.   Meds & Orders: No orders of the defined types were placed in this encounter.  No orders of the defined types were placed in this encounter.    Procedures:  Procedure: ECSWT Indications:  GTPS, Gluteal tendinopathy/tendinitis   Procedure Details Consent: Risks of procedure as well as the alternatives and risks of each were explained to the patient.  Verbal consent for procedure obtained. Time Out: Verified patient identification, verified procedure, site was marked, verified correct patient position. The area was cleaned with alcohol swab.     The left greater trochanteric region and gluteal tendons was targeted for Extracorporeal shockwave therapy.    Preset: Greater trochanteric pain syndrome Power Level: 110 mJ Frequency: 12 Hz Impulse/cycles: 3000 Head size: Regular   Patient tolerated procedure well without immediate complications.       Clinical History: No specialty comments available.  She reports that she has quit smoking. Her smoking use included cigarettes. She has a 1.5 pack-year smoking history. She has never used smokeless tobacco. No results for input(s): HGBA1C, LABURIC in the last 8760 hours.  Objective:   Vital Signs: LMP 07/30/2002   Physical Exam  Gen: Well-appearing, in no acute distress; non-toxic CV: Well-perfused. Warm.  Resp: Breathing unlabored on room air; no wheezing. Psych: Fluid speech in conversation; appropriate affect; normal  thought process  Ortho Exam - Left hip/Low back: No specific pain with light palpation of the trochanteric bursa region on the mild discomfort at the insertion of the gluteal tendons with some fascial restriction of the proximal IT band.  No redness or swelling.  There is 5/5 hip strength with abduction bilaterally, although the left leg does have a  degree of fatigable weakness.  Imaging: No results found.  Past Medical/Family/Surgical/Social History: Medications & Allergies reviewed per EMR, new medications updated. 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   Hemorrhoids    external   Family History  Problem Relation Age of Onset   Prostate cancer Father    Past Surgical History:  Procedure Laterality Date   adnoidectomy     as child   CESAREAN SECTION  1989   x 1   DILATATION & CURETTAGE/HYSTEROSCOPY WITH MYOSURE N/A 10/10/2015   Procedure: DILATATION & CURETTAGE/HYSTEROSCOPY WITH MYOSURE;  Surgeon: Alicia GORMAN Pinal, MD;  Location: [facility];  Service: Gynecology;  Laterality: N/A;   DILATION AND CURETTAGE OF UTERUS     MAB in 1993 or 1994   SKIN BIOPSY Left 09/30/2019   verruca vulgaris, irritated   SKIN BIOPSY Right 02/26/2020   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

## 2024-05-14 NOTE — Progress Notes (Signed)
 Patient says that she is noticing overall improvement both with the hip, and with her back. She is happy with her relief from the shockwave therapy. She is here today for final treatment. She says that she is still using the Nitroglycerin  patch, and would like to know how long she should continue that treatment. She says that she will take Ibuprofen  at night to help with tenderness on the side of the hip when she lays on her side. She does mention being more consistent with her home exercises moving forward.

## 2024-05-15 ENCOUNTER — Other Ambulatory Visit (HOSPITAL_COMMUNITY): Payer: Self-pay

## 2024-05-15 ENCOUNTER — Other Ambulatory Visit: Payer: Self-pay | Admitting: Family Medicine

## 2024-05-15 DIAGNOSIS — F5102 Adjustment insomnia: Secondary | ICD-10-CM

## 2024-05-15 MED ORDER — ZOLPIDEM TARTRATE 5 MG PO TABS
5.0000 mg | ORAL_TABLET | Freq: Every evening | ORAL | 0 refills | Status: DC | PRN
  Filled 2024-05-15: qty 30, 30d supply, fill #0

## 2024-05-15 NOTE — Telephone Encounter (Signed)
 Copied from CRM [id number]. Topic: Clinical - Medication Refill >> May 15, 2024 12:25 PM Donee H wrote: Medication: zolpidem  (AMBIEN ) 5 MG tablet  Has the patient contacted their pharmacy? Yes, was told to reach out to provider  This is the patient's preferred pharmacy:  [facility] [address] [place] [address] Phone: [phone] Fax: [phone]  Is this the correct pharmacy for this prescription? Yes  Has the prescription been filled recently? No  Is the patient out of the medication? No, but only has a few left  Has the patient been seen for an appointment in the last year OR does the patient have an upcoming appointment? Yes  Can we respond through MyChart? Yes  Agent: Please be advised that Rx refills may take up to 3 business days. We ask that you follow-up with your pharmacy.

## 2024-05-18 ENCOUNTER — Other Ambulatory Visit (HOSPITAL_COMMUNITY): Payer: Self-pay

## 2024-05-19 ENCOUNTER — Other Ambulatory Visit (HOSPITAL_COMMUNITY): Payer: Self-pay

## 2024-06-01 ENCOUNTER — Encounter: Payer: Self-pay | Admitting: Radiology

## 2024-06-12 ENCOUNTER — Other Ambulatory Visit: Payer: Self-pay | Admitting: Family Medicine

## 2024-06-12 DIAGNOSIS — F5102 Adjustment insomnia: Secondary | ICD-10-CM

## 2024-06-12 NOTE — Telephone Encounter (Signed)
 Copied from CRM #[id number]. Topic: Clinical - Medication Refill >> Jun 12, 2024  4:46 PM Joesph B wrote: Medication:  zolpidem  (AMBIEN ) 5 MG tablet   Has the patient contacted their pharmacy? Yes (Agent: If no, request that the patient contact the pharmacy for the refill. If patient does not wish to contact the pharmacy document the reason why and proceed with request.) (Agent: If yes, when and what did the pharmacy advise?)  This is the patient's preferred pharmacy:  [facility] [address] [place] [address] Phone: [phone] Fax: [phone]  Is this the correct pharmacy for this prescription? Yes If no, delete pharmacy and type the correct one.   Has the prescription been filled recently? Yes  Is the patient out of the medication? No  Has the patient been seen for an appointment in the last year OR does the patient have an upcoming appointment? Yes  Can we respond through MyChart? Yes  Agent: Please be advised that Rx refills may take up to 3 business days. We ask that you follow-up with your pharmacy.

## 2024-06-15 MED ORDER — ZOLPIDEM TARTRATE 5 MG PO TABS
5.0000 mg | ORAL_TABLET | Freq: Every evening | ORAL | 0 refills | Status: AC | PRN
  Filled 2024-06-15: qty 30, 30d supply, fill #0

## 2024-06-16 ENCOUNTER — Other Ambulatory Visit (HOSPITAL_COMMUNITY): Payer: Self-pay

## 2024-06-17 ENCOUNTER — Other Ambulatory Visit (HOSPITAL_COMMUNITY): Payer: Self-pay
